# Patient Record
Sex: Female | Born: 1995
Health system: Southern US, Community
[De-identification: ages and names within clinical notes are randomized; demographics above are authoritative.]

## PROBLEM LIST (undated history)

## (undated) DIAGNOSIS — R87629 Unspecified abnormal cytological findings in specimens from vagina: Secondary | ICD-10-CM

## (undated) DIAGNOSIS — J45909 Unspecified asthma, uncomplicated: Secondary | ICD-10-CM

## (undated) DIAGNOSIS — E0590 Thyrotoxicosis, unspecified without thyrotoxic crisis or storm: Secondary | ICD-10-CM

## (undated) HISTORY — DX: Thyrotoxicosis, unspecified without thyrotoxic crisis or storm: E05.90

## (undated) HISTORY — DX: Unspecified abnormal cytological findings in specimens from vagina: R87.629

## (undated) HISTORY — PX: TONSILECTOMY/ADENOIDECTOMY WITH MYRINGOTOMY: SHX6125

---

## 2015-09-14 ENCOUNTER — Encounter: Payer: Self-pay | Admitting: Endocrinology

## 2015-09-14 ENCOUNTER — Ambulatory Visit (INDEPENDENT_AMBULATORY_CARE_PROVIDER_SITE_OTHER): Payer: BLUE CROSS/BLUE SHIELD | Admitting: Endocrinology

## 2015-09-14 VITALS — BP 116/62 | HR 112 | Temp 98.5°F | Ht 63.0 in | Wt 108.0 lb

## 2015-09-14 DIAGNOSIS — E0590 Thyrotoxicosis, unspecified without thyrotoxic crisis or storm: Secondary | ICD-10-CM

## 2015-09-14 MED ORDER — METHIMAZOLE 10 MG PO TABS: 20.0000 mg | ORAL_TABLET | Freq: Two times a day (BID) | ORAL | Status: DC

## 2015-09-14 NOTE — Patient Instructions (Addendum)
i have sent a prescription to your pharmacy, to slow the thyroid. if ever you have fever while taking methimazole, stop it and call us, even if the reason is obvious, because of the risk of a rare side-effect. In view of your medical condition, you should avoid pregnancy until we have decided it is safe.   The pill you were probably given for the heart rate was metoprolol or atenolol.  If you take 1 of these, do not exceed 25 mg per day, or your blood pressure could go too low.   Please come back for a follow-up appointment in 4-6 weeks.

## 2015-09-14 NOTE — Progress Notes (Signed)
Subjective:    Patient ID: Lindsay Walker, female    DOB: June 13, 1996, 19 y.o.   MRN: [id number]  HPI    Pt states 1 month of moderate tremor of the hands, and assoc itching.  She has no prior h/o any thyroid problem.  she has never had thyroid imaging.  she has never had XRT to the anterior neck, or thyroid surgery.  she does not consume kelp or any other prescribed or non-prescribed thyroid medication.  she has never been on amiodarone.  No past medical history on file.  No past surgical history on file.  Social History   Social History  . Marital Status: Single    Spouse Name: N/A  . Number of Children: N/A  . Years of Education: N/A   Occupational History  . Not on file.   Social History Main Topics  . Smoking status: Never Smoker   . Smokeless tobacco: Not on file  . Alcohol Use: Not on file  . Drug Use: Not on file  . Sexual Activity: Not on file   Other Topics Concern  . Not on file   Social History Narrative  . No narrative on file    No current outpatient prescriptions on file prior to visit.   No current facility-administered medications on file prior to visit.    Allergies  Allergen Reactions  . Penicillins     Hives Swelling     Family History  Problem Relation Age of Onset  . Thyroid disease Neg Hx     BP 116/62 mmHg  Pulse 112  Temp(Src) 98.5 F (36.9 C) (Oral)  Ht  (1.6 m)  Wt 108 lb (48.988 kg)  BMI 19.14 kg/m2  SpO2 97%  LMP 09/13/2015  Review of Systems denies weight loss, hoarseness, visual loss, sob, diarrhea, polyuria, muscle weakness, excessive diaphoresis, numbness, heat intolerance, easy bruising, and rhinorrhea.  She has headache, anxiety, and palpitations.     Objective:   Physical Exam VS: see vs page GEN: no distress HEAD: head: no deformity eyes: no periorbital swelling; slight bilat proptosis external nose and ears are normal mouth: no lesion seen NECK: thyroid is 3-5 times normal size, R>L, firm  texture. CHEST WALL: no deformity LUNGS:  Clear to auscultation CV: tachycardic rate, but reg rhythm, no murmur ABD: abdomen is soft, nontender.  no hepatosplenomegaly.  not distended.  no hernia.   MUSCULOSKELETAL: muscle bulk and strength are grossly normal.  no obvious joint swelling.  gait is normal and steady EXTEMITIES: no deformity.  no edema. NEURO:  cn 2-12 grossly intact.   readily moves all 4's.  sensation is intact to touch on all 4's.  Slight tremor of the hands SKIN:  Normal texture and temperature.  No rash or suspicious lesion is visible.   NODES:  None palpable at the neck PSYCH: alert, well-oriented.  Does not appear anxious nor depressed.   I have reviewed outside records, and summarized:  Pt was noted to have hyperthyroidism, and referred here.  outside test results are reviewed: TSH is undetectable.     Assessment & Plan:  Hyperthyroidism, new, prob due to Grave's dz.  Tapazole is chosen as initial rx, due to tachycardia  Patient is advised the following: Patient Instructions  i have sent a prescription to your pharmacy, to slow the thyroid. if ever you have fever while taking methimazole, stop it and call us, even if the reason is obvious, because of the risk of a rare side-effect. In view  of your medical condition, you should avoid pregnancy until we have decided it is safe.   The pill you were probably given for the heart rate was metoprolol or atenolol.  If you take 1 of these, do not exceed 25 mg per day, or your blood pressure could go too low.   Please come back for a follow-up appointment in 4-6 weeks.

## 2015-09-16 DIAGNOSIS — E0590 Thyrotoxicosis, unspecified without thyrotoxic crisis or storm: Secondary | ICD-10-CM | POA: Insufficient documentation

## 2015-09-17 ENCOUNTER — Ambulatory Visit: Payer: Self-pay | Admitting: Neurology

## 2015-11-05 ENCOUNTER — Ambulatory Visit: Payer: BLUE CROSS/BLUE SHIELD | Admitting: Endocrinology

## 2015-11-14 ENCOUNTER — Encounter: Payer: Self-pay | Admitting: Endocrinology

## 2015-11-14 ENCOUNTER — Ambulatory Visit (INDEPENDENT_AMBULATORY_CARE_PROVIDER_SITE_OTHER): Payer: BLUE CROSS/BLUE SHIELD | Admitting: Endocrinology

## 2015-11-14 VITALS — BP 106/62 | HR 134 | Temp 98.2°F | Ht 63.0 in | Wt 115.0 lb

## 2015-11-14 DIAGNOSIS — E0590 Thyrotoxicosis, unspecified without thyrotoxic crisis or storm: Secondary | ICD-10-CM | POA: Diagnosis not present

## 2015-11-14 LAB — TSH: TSH: 0.01 u[IU]/mL — AB (ref 0.35–5.50)

## 2015-11-14 NOTE — Patient Instructions (Signed)
blood tests are requested for you today.  We'll let you know about the results.  if ever you have fever while taking methimazole, stop it and call us, even if the reason is obvious, because of the risk of a rare side-effect. In view of your medical condition, you should avoid pregnancy until we have decided it is safe.   Please come back for a follow-up appointment in 2 months.

## 2015-11-14 NOTE — Progress Notes (Signed)
   Subjective:    Patient ID: Lindsay Walker, female    DOB: Nov 28, 1995, 20 y.o.   MRN: [id number]  HPI Pt returns for f/u of hyperthyroidism (dx'ed late 2016; she has never had thyroid imaging, but Grave's Dz is presumed, due to severity; tapazole is chosen as initial rx, due to tachycardia).  pt states she feels better in general, except for persistent palpitations. She misses the PM tapazole dose approx twice a week. No past medical history on file.  No past surgical history on file.  Social History   Social History  . Marital Status: Single    Spouse Name: N/A  . Number of Children: N/A  . Years of Education: N/A   Occupational History  . Not on file.   Social History Main Topics  . Smoking status: Never Smoker   . Smokeless tobacco: Not on file  . Alcohol Use: Not on file  . Drug Use: Not on file  . Sexual Activity: Not on file   Other Topics Concern  . Not on file   Social History Narrative    Current Outpatient Prescriptions on File Prior to Visit  Medication Sig Dispense Refill  . albuterol (PROVENTIL) (5 MG/ML) 0.5% nebulizer solution Take 2.5 mg by nebulization every 6 (six) hours as needed for wheezing or shortness of breath.    . loratadine (CLARITIN) 10 MG tablet Take 10 mg by mouth daily.     No current facility-administered medications on file prior to visit.    Allergies  Allergen Reactions  . Penicillins     Hives Swelling     Family History  Problem Relation Age of Onset  . Thyroid disease Neg Hx     BP 106/62 mmHg  Pulse 134  Temp(Src) 98.2 F (36.8 C) (Oral)  Ht  (1.6 m)  Wt 115 lb (52.164 kg)  BMI 20.38 kg/m2  SpO2 97%  Review of Systems Denies fever    Objective:   Physical Exam VITAL SIGNS:  See vs page GENERAL: no distress NECK: thyroid is 3-5 times normal size, R>L, firm texture. Neuro: no tremor  Lab Results  Component Value Date   TSH 0.01* 11/14/2015       Assessment & Plan:  Hyperthyroidism, persistent  despite rx  Patient is advised the following: Patient Instructions  blood tests are requested for you today.  We'll let you know about the results.  if ever you have fever while taking methimazole, stop it and call us, even if the reason is obvious, because of the risk of a rare side-effect. In view of your medical condition, you should avoid pregnancy until we have decided it is safe.   Please come back for a follow-up appointment in 2 months.   addendum: increase tapazole to 40-BID.  Please come back for a follow-up appointment in 1 month.

## 2015-11-15 LAB — T4, FREE: Free T4: 3.02 ng/dL — ABNORMAL HIGH (ref 0.60–1.60)

## 2015-11-15 MED ORDER — METHIMAZOLE 10 MG PO TABS: 40.0000 mg | ORAL_TABLET | Freq: Two times a day (BID) | ORAL | Status: DC

## 2016-01-11 ENCOUNTER — Ambulatory Visit: Payer: BLUE CROSS/BLUE SHIELD | Admitting: Endocrinology

## 2016-01-30 ENCOUNTER — Encounter: Payer: Self-pay | Admitting: Endocrinology

## 2016-01-30 ENCOUNTER — Ambulatory Visit (INDEPENDENT_AMBULATORY_CARE_PROVIDER_SITE_OTHER): Payer: Self-pay | Admitting: Endocrinology

## 2016-01-30 VITALS — BP 106/70 | HR 87 | Temp 98.1°F | Ht 63.0 in | Wt 126.0 lb

## 2016-01-30 DIAGNOSIS — E0590 Thyrotoxicosis, unspecified without thyrotoxic crisis or storm: Secondary | ICD-10-CM

## 2016-01-30 LAB — T4, FREE: Free T4: 0.77 ng/dL (ref 0.60–1.60)

## 2016-01-30 LAB — TSH: TSH: 0.03 u[IU]/mL — ABNORMAL LOW (ref 0.35–5.50)

## 2016-01-30 MED ORDER — METHIMAZOLE 10 MG PO TABS: 20.0000 mg | ORAL_TABLET | Freq: Two times a day (BID) | ORAL | Status: DC

## 2016-01-30 NOTE — Progress Notes (Signed)
   Subjective:    Patient ID: Lindsay Walker, female    DOB: 06/11/1996, 20 y.o.   MRN: [id number]  HPI Pt returns for f/u of hyperthyroidism (dx'ed late 2016; she has never had thyroid imaging, but Grave's Dz is presumed, due to severity; tapazole is chosen as initial rx, due to tachycardia).  pt states she feels better in general.  She says she seldom misses the tapazole.   No past medical history on file.  No past surgical history on file.  Social History   Social History  . Marital Status: Single    Spouse Name: N/A  . Number of Children: N/A  . Years of Education: N/A   Occupational History  . Not on file.   Social History Main Topics  . Smoking status: Never Smoker   . Smokeless tobacco: Not on file  . Alcohol Use: Not on file  . Drug Use: Not on file  . Sexual Activity: Not on file   Other Topics Concern  . Not on file   Social History Narrative    Current Outpatient Prescriptions on File Prior to Visit  Medication Sig Dispense Refill  . albuterol (PROVENTIL) (5 MG/ML) 0.5% nebulizer solution Take 2.5 mg by nebulization every 6 (six) hours as needed for wheezing or shortness of breath.    . loratadine (CLARITIN) 10 MG tablet Take 10 mg by mouth daily.     No current facility-administered medications on file prior to visit.    Allergies  Allergen Reactions  . Penicillins     Hives Swelling     Family History  Problem Relation Age of Onset  . Thyroid disease Neg Hx     BP 106/70 mmHg  Pulse 87  Temp(Src) 98.1 F (36.7 C) (Oral)  Ht 5\' 3"  (1.6 m)  Wt 126 lb (57.153 kg)  BMI 22.33 kg/m2  SpO2 98%  Review of Systems Denies fever    Objective:   Physical Exam VITAL SIGNS:  See vs page GENERAL: no distress NECK: thyroid is 3-5 times normal size, R>L, firm texture. Neuro: no tremor.     Lab Results  Component Value Date   TSH 0.03* 01/30/2016      Assessment & Plan:  Hyperthyroidism: improved.  Patient is advised the following: Patient  Instructions  blood tests are requested for you today.  We'll let you know about the results.  if ever you have fever while taking methimazole, stop it and call us, even if the reason is obvious, because of the risk of a rare side-effect. In view of your medical condition, you should avoid pregnancy until we have decided it is safe.   Let me know if you decide to take the radioactive iodine pill.   Please come back for a follow-up appointment in 2-3 months.   addendum: reduce tapazole to 20 mg bid.

## 2016-01-30 NOTE — Patient Instructions (Addendum)
blood tests are requested for you today.  We'll let you know about the results.  if ever you have fever while taking methimazole, stop it and call us, even if the reason is obvious, because of the risk of a rare side-effect. In view of your medical condition, you should avoid pregnancy until we have decided it is safe.   Let me know if you decide to take the radioactive iodine pill.   Please come back for a follow-up appointment in 2-3 months.

## 2016-04-25 ENCOUNTER — Ambulatory Visit: Payer: BLUE CROSS/BLUE SHIELD | Admitting: Endocrinology

## 2016-04-28 ENCOUNTER — Ambulatory Visit: Payer: BLUE CROSS/BLUE SHIELD | Admitting: Endocrinology

## 2016-04-30 ENCOUNTER — Encounter (HOSPITAL_COMMUNITY): Payer: Self-pay | Admitting: Emergency Medicine

## 2016-04-30 ENCOUNTER — Ambulatory Visit (HOSPITAL_COMMUNITY)
Admission: EM | Admit: 2016-04-30 | Discharge: 2016-04-30 | Disposition: A | Discharge status: Home / Self Care | Payer: BLUE CROSS/BLUE SHIELD | Attending: Emergency Medicine | Admitting: Emergency Medicine

## 2016-04-30 DIAGNOSIS — Z880 Allergy status to penicillin: Secondary | ICD-10-CM | POA: Insufficient documentation

## 2016-04-30 DIAGNOSIS — N760 Acute vaginitis: Secondary | ICD-10-CM | POA: Diagnosis present

## 2016-04-30 DIAGNOSIS — Z79899 Other long term (current) drug therapy: Secondary | ICD-10-CM | POA: Insufficient documentation

## 2016-04-30 HISTORY — DX: Unspecified asthma, uncomplicated: J45.909

## 2016-04-30 LAB — POCT URINALYSIS DIP (DEVICE)
Bilirubin Urine: NEGATIVE
Glucose, UA: NEGATIVE mg/dL
Ketones, ur: NEGATIVE mg/dL
Leukocytes, UA: NEGATIVE
NITRITE: NEGATIVE
PH: 6.5 (ref 5.0–8.0)
Protein, ur: 30 mg/dL — AB
Specific Gravity, Urine: 1.025 (ref 1.005–1.030)
UROBILINOGEN UA: 0.2 mg/dL (ref 0.0–1.0)

## 2016-04-30 LAB — POCT PREGNANCY, URINE: Preg Test, Ur: NEGATIVE

## 2016-04-30 MED ORDER — METRONIDAZOLE 500 MG PO TABS: 500.0000 mg | ORAL_TABLET | Freq: Two times a day (BID) | ORAL | Status: DC

## 2016-04-30 MED ORDER — FLUCONAZOLE 150 MG PO TABS: 150.0000 mg | ORAL_TABLET | Freq: Every day | ORAL | Status: DC

## 2016-04-30 NOTE — ED Notes (Signed)
Patient reports vaginal irritation for 4 weeks.  Denies vaginal discharge, denies abdominal pain, denies back pain.  Denies uti symptoms.  Patient has been off a birth control for more than a year.

## 2016-04-30 NOTE — Discharge Instructions (Signed)
Start Flagyl as directed. Take Diflucan 1 tablet today. Then repeat 1 tablet in 3 to 4 days. Follow-up with your primary care provider if symptoms do not resolve in 7 days.    Vaginitis Vaginitis is an inflammation of the vagina. It is most often caused by a change in the normal balance of the bacteria and yeast that live in the vagina. This change in balance causes an overgrowth of certain bacteria or yeast, which causes the inflammation. There are different types of vaginitis, but the most common types are:  Bacterial vaginosis.  Yeast infection (candidiasis).  Trichomoniasis vaginitis. This is a sexually transmitted infection (STI).  Viral vaginitis.  Atrophic vaginitis.  Allergic vaginitis. CAUSES  The cause depends on the type of vaginitis. Vaginitis can be caused by:  Bacteria (bacterial vaginosis).  Yeast (yeast infection).  A parasite (trichomoniasis vaginitis)  A virus (viral vaginitis).  Low hormone levels (atrophic vaginitis). Low hormone levels can occur during pregnancy, breastfeeding, or after menopause.  Irritants, such as bubble baths, scented tampons, and feminine sprays (allergic vaginitis). Other factors can change the normal balance of the yeast and bacteria that live in the vagina. These include:  Antibiotic medicines.  Poor hygiene.  Diaphragms, vaginal sponges, spermicides, birth control pills, and intrauterine devices (IUD).  Sexual intercourse.  Infection.  Uncontrolled diabetes.  A weakened immune system. SYMPTOMS  Symptoms can vary depending on the cause of the vaginitis. Common symptoms include:  Abnormal vaginal discharge.  The discharge is white, gray, or yellow with bacterial vaginosis.  The discharge is thick, white, and cheesy with a yeast infection.  The discharge is frothy and yellow or greenish with trichomoniasis.  A bad vaginal odor.  The odor is fishy with bacterial vaginosis.  Vaginal itching, pain, or  swelling.  Painful intercourse.  Pain or burning when urinating. Sometimes, there are no symptoms. TREATMENT  Treatment will vary depending on the type of infection.   Bacterial vaginosis and trichomoniasis are often treated with antibiotic creams or pills.  Yeast infections are often treated with antifungal medicines, such as vaginal creams or suppositories.  Viral vaginitis has no cure, but symptoms can be treated with medicines that relieve discomfort. Your sexual partner should be treated as well.  Atrophic vaginitis may be treated with an estrogen cream, pill, suppository, or vaginal ring. If vaginal dryness occurs, lubricants and moisturizing creams may help. You may be told to avoid scented soaps, sprays, or douches.  Allergic vaginitis treatment involves quitting the use of the product that is causing the problem. Vaginal creams can be used to treat the symptoms. HOME CARE INSTRUCTIONS   Take all medicines as directed by your caregiver.  Keep your genital area clean and dry. Avoid soap and only rinse the area with water.  Avoid douching. It can remove the healthy bacteria in the vagina.  Do not use tampons or have sexual intercourse until your vaginitis has been treated. Use sanitary pads while you have vaginitis.  Wipe from front to back. This avoids the spread of bacteria from the rectum to the vagina.  Let air reach your genital area.  Wear cotton underwear to decrease moisture buildup.  Avoid wearing underwear while you sleep until your vaginitis is gone.  Avoid tight pants and underwear or nylons without a cotton panel.  Take off wet clothing (especially bathing suits) as soon as possible.  Use mild, non-scented products. Avoid using irritants, such as:  Scented feminine sprays.  Fabric softeners.  Scented detergents.  Scented tampons.  Scented soaps or bubble baths.  Practice safe sex and use condoms. Condoms may prevent the spread of trichomoniasis  and viral vaginitis. SEEK MEDICAL CARE IF:   You have abdominal pain.  You have a fever or persistent symptoms for more than 2-3 days.  You have a fever and your symptoms suddenly get worse.   This information is not intended to replace advice given to you by your health care provider. Make sure you discuss any questions you have with your health care provider.   Document Released: 07/27/2007 Document Revised: 02/13/2015 Document Reviewed: 03/11/2012 Elsevier Interactive Patient Education [employer]2016 [employer] Inc[employer]

## 2016-05-01 LAB — CERVICOVAGINAL ANCILLARY ONLY
Chlamydia: NEGATIVE
Neisseria Gonorrhea: NEGATIVE
WET PREP (BD AFFIRM): NEGATIVE

## 2016-05-01 NOTE — ED Provider Notes (Signed)
CSN: [id number]     Arrival date & time 04/30/16  1235 History   First MD Initiated Contact with Patient 04/30/16 1344     Chief Complaint  Patient presents with  . Vaginal Itching   (Consider location/radiation/quality/duration/timing/severity/associated sxs/prior Treatment) HPI Comments: Patient presents with vaginal irritation and slight itching for about 1 month. She denies any discharge, unusual bleeding, dysuria or back pain. She admits to using a new body wash in the vaginal area. She is sexually active with 1 partner and occasionally uses condoms. She is currently on her period. She has no previous history of STD's. She has not tried anything for her symptoms.   Patient is a 20 y.o. female presenting with vaginal itching. The history is provided by the patient.  Vaginal Itching This is a new problem. The current episode started more than 1 week ago. The problem occurs daily. The problem has not changed since onset.Pertinent negatives include no abdominal pain.    Past Medical History  Diagnosis Date  . Asthma    History reviewed. No pertinent past surgical history. Family History  Problem Relation Age of Onset  . Thyroid disease Neg Hx   . Asthma Mother    Social History  Substance Use Topics  . Smoking status: Never Smoker   . Smokeless tobacco: None  . Alcohol Use: No   OB History    No data available     Review of Systems  Constitutional: Negative for fever.  Gastrointestinal: Negative for nausea and abdominal pain.  Genitourinary: Negative for dysuria, flank pain, vaginal discharge, genital sores, menstrual problem and pelvic pain.       Vaginal itching    Allergies  Penicillins  Home Medications   Prior to Admission medications   Medication Sig Start Date End Date Taking? Authorizing Provider  albuterol (PROVENTIL) (5 MG/ML) 0.5% nebulizer solution Take 2.5 mg by nebulization every 6 (six) hours as needed for wheezing or shortness of breath.    Historical  Provider, MD  fluconazole (DIFLUCAN) 150 MG tablet Take 1 tablet (150 mg total) by mouth daily. Repeat in 3 days. 04/30/16   Sudie GrumblingAnn Berry Barnard Sharps, NP  loratadine (CLARITIN) 10 MG tablet Take 10 mg by mouth daily.    Historical Provider, MD  methimazole (TAPAZOLE) 10 MG tablet Take 2 tablets (20 mg total) by mouth 2 (two) times daily. 01/30/16   Romero BellingSean Ellison, MD  metroNIDAZOLE (FLAGYL) 500 MG tablet Take 1 tablet (500 mg total) by mouth 2 (two) times daily. For 7 days. Take with food. No alcohol. 04/30/16   Sudie GrumblingAnn Berry Devion Chriscoe, NP   Meds Ordered and Administered this Visit  Medications - No data to display  BP 118/75 mmHg  Pulse 65  Temp(Src) 98.2 F (36.8 C) (Oral)  Resp 16  Ht 5\' 3"  (1.6 m)  Wt 125 lb (56.7 kg)  BMI 22.15 kg/m2  SpO2 100%  LMP 04/30/2016 No data found.   Physical Exam  Constitutional: She is oriented to person, place, and time. She appears well-developed and well-nourished.  Cardiovascular: Normal rate, regular rhythm and normal heart sounds.   Pulmonary/Chest: Effort normal and breath sounds normal.  Abdominal: Soft. Bowel sounds are normal. There is no tenderness. There is no CVA tenderness.  Genitourinary: Uterus normal. There is no tenderness or lesion on the right labia. There is no tenderness or lesion on the left labia. Cervix exhibits no motion tenderness. There is bleeding in the vagina. No erythema or tenderness in the vagina.  Vaginal blood  present with positive fishy odor. Irritation at perineum and vaginal vault area.No fissures or cysts seen. Non-tender.    Neurological: She is alert and oriented to person, place, and time.  Skin: Skin is warm and dry.    ED Course  Procedures (including critical care time)  Labs Review Labs Reviewed  POCT URINALYSIS DIP (DEVICE) - Abnormal; Notable for the following:    Hgb urine dipstick LARGE (*)    Protein, ur 30 (*)    All other components within normal limits  POCT PREGNANCY, URINE  CERVICOVAGINAL ANCILLARY ONLY   CERVICOVAGINAL ANCILLARY ONLY    Imaging Review No results found.   Visual Acuity Review  Right Eye Distance:   Left Eye Distance:   Bilateral Distance:    Right Eye Near:   Left Eye Near:    Bilateral Near:         MDM   1. Vaginitis and vulvovaginitis   Cervical specimen collected for GC/Chlamydia/Trich/Candida and BV. Reviewed with patient likelihood of BV due to clinical findings. May also have a external yeast infection. Recommend Flagyl as directed- reviewed no alcohol while on medication. May also take Diflucan today and repeat in 3 days. Discussed that urine was positive for blood and protein which most likely is due to vaginal bleeding of period. Reviewed no sex until finished medication and lab results have been discussed. Will follow-up pending lab results.     Sudie Grumbling, NP 05/01/16 2259

## 2016-05-08 ENCOUNTER — Ambulatory Visit: Payer: BLUE CROSS/BLUE SHIELD | Admitting: Endocrinology

## 2016-05-08 DIAGNOSIS — Z0289 Encounter for other administrative examinations: Secondary | ICD-10-CM

## 2016-07-07 ENCOUNTER — Encounter: Payer: Self-pay | Admitting: Endocrinology

## 2016-07-07 ENCOUNTER — Ambulatory Visit (INDEPENDENT_AMBULATORY_CARE_PROVIDER_SITE_OTHER): Payer: Managed Care, Other (non HMO) | Admitting: Endocrinology

## 2016-07-07 VITALS — BP 112/56 | HR 119 | Ht 63.0 in | Wt 109.0 lb

## 2016-07-07 DIAGNOSIS — E0590 Thyrotoxicosis, unspecified without thyrotoxic crisis or storm: Secondary | ICD-10-CM | POA: Diagnosis not present

## 2016-07-07 LAB — T4, FREE: FREE T4: 5.33 ng/dL — AB (ref 0.60–1.60)

## 2016-07-07 LAB — TSH: TSH: 0.04 u[IU]/mL — AB (ref 0.35–5.50)

## 2016-07-07 MED ORDER — METHIMAZOLE 10 MG PO TABS: 40.0000 mg | ORAL_TABLET | Freq: Two times a day (BID) | ORAL | 5 refills | Status: DC

## 2016-07-07 NOTE — Patient Instructions (Addendum)
blood tests are requested for you today.  We'll let you know about the results. I have sent a prescription to your pharmacy, to resume the methimazole Let us know when you can do the radioactive iodine pill. If ever you have fever while taking methimazole, stop it and call us, even if the reason is obvious, because of the risk of a rare side-effect. Please come back for a follow-up appointment in 2-3 weeks.

## 2016-07-07 NOTE — Progress Notes (Signed)
   Subjective:    Patient ID: Lindsay HarborShalia Walker, female    DOB: 01/19/1996, 20 y.o.   MRN: [id number]  HPI Pt returns for f/u of hyperthyroidism (dx'ed late 2016; she has never had thyroid imaging, but Grave's Dz is presumed, due to severity; tapazole is chosen as initial rx, also due to severity).  She has not taken tapazole in the past 3 months.  She has moderate tremor of the hands, and assoc weight loss.  She takes OC's as rx'ed.  She would like to take RAI, but cannot afford it now.   Past Medical History:  Diagnosis Date  . Asthma   . Hyperthyroidism     No past surgical history on file.  Social History   Social History  . Marital status: Single    Spouse name: N/A  . Number of children: N/A  . Years of education: N/A   Occupational History  . Not on file.   Social History Main Topics  . Smoking status: Never Smoker  . Smokeless tobacco: Not on file  . Alcohol use No  . Drug use: No  . Sexual activity: Not on file   Other Topics Concern  . Not on file   Social History Narrative  . No narrative on file    Current Outpatient Prescriptions on File Prior to Visit  Medication Sig Dispense Refill  . albuterol (PROVENTIL) (5 MG/ML) 0.5% nebulizer solution Take 2.5 mg by nebulization every 6 (six) hours as needed for wheezing or shortness of breath.    . fluconazole (DIFLUCAN) 150 MG tablet Take 1 tablet (150 mg total) by mouth daily. Repeat in 3 days. 2 tablet 0  . loratadine (CLARITIN) 10 MG tablet Take 10 mg by mouth daily.     No current facility-administered medications on file prior to visit.     Allergies  Allergen Reactions  . Penicillins     Hives Swelling     Family History  Problem Relation Age of Onset  . Thyroid disease Neg Hx   . Asthma Mother     BP (!) 112/56   Pulse (!) 119   Ht 5\' 3"  (1.6 m)   Wt 109 lb (49.4 kg)   SpO2 97%   BMI 19.31 kg/m   Review of Systems Denies denies fever, but she has palpitations.      Objective:   Physical  Exam VITAL SIGNS:  See vs page GENERAL: no distress NECK: thyroid is 5 times normal size, R>L, firm texture. Neuro: slight tremor of the hands  Lab Results  Component Value Date   TSH 0.04 (L) 07/07/2016      Assessment & Plan:  Hyperthyroidism, worse.  Noncompliance with medication: I'll work around this as best I can.  low normal BP, prob due to weight loss.  This is a relative contraindication to b-blocker.

## 2016-07-11 ENCOUNTER — Telehealth: Payer: Self-pay | Admitting: Endocrinology

## 2016-07-11 MED ORDER — METHIMAZOLE 10 MG PO TABS: 20.0000 mg | ORAL_TABLET | Freq: Three times a day (TID) | ORAL | 5 refills | Status: DC

## 2016-07-11 NOTE — Telephone Encounter (Signed)
I contacted the patient and advised of message via voicemail. Requested a call back if the patient would like to discuss.  

## 2016-07-11 NOTE — Telephone Encounter (Signed)
See message and please advise on how to proceed. Thanks!  

## 2016-07-11 NOTE — Telephone Encounter (Signed)
Methimazole 8 pills daily the insurance is not going to cover this rx.   Please call the pharmacy for what they are stating we should do pt is unaware but does not want to miss her dosing she has none at all right now

## 2016-07-11 NOTE — Telephone Encounter (Signed)
Ok, I have sent a prescription to your pharmacy, to change to 20 mg tid.  If this is hard to remember, take 3 pills, twice a day

## 2016-07-18 ENCOUNTER — Ambulatory Visit: Payer: Managed Care, Other (non HMO) | Admitting: Endocrinology

## 2016-08-18 ENCOUNTER — Ambulatory Visit: Payer: Managed Care, Other (non HMO) | Admitting: Endocrinology

## 2016-08-20 ENCOUNTER — Other Ambulatory Visit (INDEPENDENT_AMBULATORY_CARE_PROVIDER_SITE_OTHER): Payer: Managed Care, Other (non HMO)

## 2016-08-20 ENCOUNTER — Ambulatory Visit (INDEPENDENT_AMBULATORY_CARE_PROVIDER_SITE_OTHER): Payer: Managed Care, Other (non HMO) | Admitting: Endocrinology

## 2016-08-20 ENCOUNTER — Encounter: Payer: Self-pay | Admitting: Endocrinology

## 2016-08-20 VITALS — BP 102/60 | HR 80 | Ht 63.0 in | Wt 113.0 lb

## 2016-08-20 DIAGNOSIS — E0590 Thyrotoxicosis, unspecified without thyrotoxic crisis or storm: Secondary | ICD-10-CM

## 2016-08-20 LAB — T4, FREE: FREE T4: 0.86 ng/dL (ref 0.60–1.60)

## 2016-08-20 LAB — TSH: TSH: 0.01 u[IU]/mL — AB (ref 0.35–5.50)

## 2016-08-20 MED ORDER — METHIMAZOLE 10 MG PO TABS: 20.0000 mg | ORAL_TABLET | Freq: Two times a day (BID) | ORAL | 5 refills | Status: DC

## 2016-08-20 NOTE — Progress Notes (Signed)
   Subjective:    Patient ID: Lindsay Walker, female    DOB: 07/10/1996, 20 y.o.   MRN: [id number]  HPI Pt returns for f/u of hyperthyroidism (dx'ed late 2016; she has never had thyroid imaging, but Grave's Dz is presumed, due to severity; tapazole is chosen as initial rx, also due to severity; she takes OC's as rx'ed; she would like to take RAI, but cannot afford it now).  Since back on the tapazole, she feels much better Past Medical History:  Diagnosis Date  . Asthma   . Hyperthyroidism     No past surgical history on file.  Social History   Social History  . Marital status: Single    Spouse name: N/A  . Number of children: N/A  . Years of education: N/A   Occupational History  . Not on file.   Social History Main Topics  . Smoking status: Never Smoker  . Smokeless tobacco: Not on file  . Alcohol use No  . Drug use: No  . Sexual activity: Not on file   Other Topics Concern  . Not on file   Social History Narrative  . No narrative on file    Current Outpatient Prescriptions on File Prior to Visit  Medication Sig Dispense Refill  . albuterol (PROVENTIL) (5 MG/ML) 0.5% nebulizer solution Take 2.5 mg by nebulization every 6 (six) hours as needed for wheezing or shortness of breath.     No current facility-administered medications on file prior to visit.     Allergies  Allergen Reactions  . Penicillins     Hives Swelling     Family History  Problem Relation Age of Onset  . Thyroid disease Neg Hx   . Asthma Mother     BP 102/60   Pulse 80   Ht 5\' 3"  (1.6 m)   Wt 113 lb (51.3 kg)   SpO2 98%   BMI 20.02 kg/m    Review of Systems Denies fever    Objective:   Physical Exam VITAL SIGNS:  See vs page GENERAL: no distress NECK: thyroid is 5-10 times normal size, R>L, firm texture. Skin: not diaphoretic.   Neuro: no tremor  Lab Results  Component Value Date   TSH 0.01 (L) 08/20/2016      Assessment & Plan:  Hyperthyroidism: control is improved. I  have sent a prescription to your pharmacy, to reduce tapazole

## 2016-08-20 NOTE — Patient Instructions (Signed)
blood tests are requested for you today.  We'll let you know about the results. Let us know if you decide to take the radioactive iodine pill. If ever you have fever while taking methimazole, stop it and call us, even if the reason is obvious, because of the risk of a rare side-effect. Please come back for a follow-up appointment in 1 month.

## 2016-09-19 ENCOUNTER — Ambulatory Visit: Payer: Managed Care, Other (non HMO) | Admitting: Endocrinology

## 2016-10-20 ENCOUNTER — Ambulatory Visit: Payer: Managed Care, Other (non HMO) | Admitting: Endocrinology

## 2016-10-30 ENCOUNTER — Ambulatory Visit: Payer: Managed Care, Other (non HMO) | Admitting: Endocrinology

## 2017-03-11 ENCOUNTER — Ambulatory Visit (INDEPENDENT_AMBULATORY_CARE_PROVIDER_SITE_OTHER): Payer: BLUE CROSS/BLUE SHIELD | Admitting: Family Medicine

## 2017-03-11 ENCOUNTER — Other Ambulatory Visit (HOSPITAL_COMMUNITY)
Admission: RE | Admit: 2017-03-11 | Discharge: 2017-03-11 | Disposition: A | Discharge status: Home / Self Care | Payer: BLUE CROSS/BLUE SHIELD | Source: Ambulatory Visit | Attending: Family Medicine | Admitting: Family Medicine

## 2017-03-11 ENCOUNTER — Ambulatory Visit: Payer: Managed Care, Other (non HMO) | Admitting: Family Medicine

## 2017-03-11 VITALS — BP 112/72 | HR 95 | Temp 98.8°F | Ht 63.0 in | Wt 113.4 lb

## 2017-03-11 DIAGNOSIS — J45909 Unspecified asthma, uncomplicated: Secondary | ICD-10-CM | POA: Diagnosis not present

## 2017-03-11 DIAGNOSIS — N898 Other specified noninflammatory disorders of vagina: Secondary | ICD-10-CM | POA: Insufficient documentation

## 2017-03-11 DIAGNOSIS — E0590 Thyrotoxicosis, unspecified without thyrotoxic crisis or storm: Secondary | ICD-10-CM | POA: Insufficient documentation

## 2017-03-11 DIAGNOSIS — A630 Anogenital (venereal) warts: Secondary | ICD-10-CM

## 2017-03-11 MED ORDER — FLUCONAZOLE 150 MG PO TABS: 150.0000 mg | ORAL_TABLET | Freq: Once | ORAL | 0 refills | Status: AC

## 2017-03-11 NOTE — Patient Instructions (Signed)
It was very nice to see you today- take care and I will be in touch with your labs asap We are going to treat you for yeast vaginitis with diflucan- take one pill, and if necessary you can repeat in 1 week You do appear to have some genital warts- we treated by freezing today, and can re-freeze in 3-4 weeks.  Let me know if any concerns- some soreness and possibly small blisters after freezing is normal but don't hesitate to contact me if you are worried

## 2017-03-11 NOTE — Progress Notes (Signed)
[facility] [address] [place], [place] [address] [phone]  Date:  03/11/2017   Name:  Lindsay Walker   DOB:  29-Nov-1995   MRN:  [id number]  PCP:  Assunta Found, PA    Chief Complaint: Vaginal Itching (c/o vaginal itching that comes and goes that started yestereday. )   History of Present Illness:  Lindsay Walker is a 21 y.o. very pleasant female patient who presents with the following:  She has noted vagianl itching for a couple of days and would like to make sure all is well No burning No discharge She has had a yeast infection in the past, but this seems different No recent abx use   She has hyperthyroidism she is on methimazole.  She takes 60 mg a day- her endocrinologist is in [place]- she cannot think of their name.  She may at some point have an ablation procedure but for now they are suppressing her thyroid medically    She has asthma- however this does not really bother her and she is not on any medication for same  She is on Depo- provera for her contraceptive Her last shot was in March- she is UTD She did have a pap early this year- it was negative She is s/p Gardasil series  She is a Consulting civil engineer at [employer]; she majors in Building surveyor and Spanish and hopes to attend veterinary school after she graduates next year.  She also works part time.  She will be taking summer classes  She is SA but has not had any new partners recently  Patient Active Problem List   Diagnosis Date Noted  . Hyperthyroidism 09/16/2015    Past Medical History:  Diagnosis Date  . Asthma   . Hyperthyroidism     No past surgical history on file.  Social History  Substance Use Topics  . Smoking status: Never Smoker  . Smokeless tobacco: Not on file  . Alcohol use No    Family History  Problem Relation Age of Onset  . Thyroid disease Neg Hx   . Asthma Mother     Allergies  Allergen Reactions  . Penicillins    Hives Swelling     Medication list has been reviewed and updated.  Current Outpatient Prescriptions on File Prior to Visit  Medication Sig Dispense Refill  . albuterol (PROVENTIL) (5 MG/ML) 0.5% nebulizer solution Take 2.5 mg by nebulization every 6 (six) hours as needed for wheezing or shortness of breath.    . methimazole (TAPAZOLE) 10 MG tablet Take 2 tablets (20 mg total) by mouth 2 (two) times daily. (Patient taking differently: Take 20 mg by mouth 3 (three) times daily. ) 120 tablet 5   No current facility-administered medications on file prior to visit.     Review of Systems:  As per HPI- otherwise negative.  No fever, chills, nausea, vomiting, rash   Physical Examination: Vitals:   03/11/17 1225  BP: 112/72  Pulse: 95  Temp: 98.8 F (37.1 C)   Vitals:   03/11/17 1225  Weight: 113 lb 6.4 oz (51.4 kg)  Height: 5\' 3"  (1.6 m)   Body mass index is 20.09 kg/m. Ideal Body Weight: Weight in (lb) to have BMI = 25: 140.8  GEN: WDWN, NAD, Non-toxic, A & O x 3, very petite build HEENT: Atraumatic, Normocephalic. Neck supple. No masses, No LAD. Ears and Nose: No external deformity. CV: RRR, No M/G/R. No JVD. No  thrill. No extra heart sounds. PULM: CTA B, no wheezes, crackles, rhonchi. No retractions. No resp. distress. No accessory muscle use. ABD: S, NT, ND, +BS. No rebound. No HSM. EXTR: No c/c/e NEURO Normal gait.  PSYCH: Normally interactive. Conversant. Not depressed or anxious appearing.  Calm demeanor.  Pelvic: normal, no vaginal lesions or discharge. Uterus normal, no CMT, no adnexal tendereness or masses However she does have what appear to be small warts on her buttocks near the vaginal introitus- 6 on the left and 2 on the right.  Pt states that she has noted these bumps for 6- 12 months.  However she was not sure what they were, and no one mentioned them to her at her pap earlier this year.    She would like to have cryotherapy today VC obtained,  Cryotherapy  with LN on all warts x3 each.  Pt tolerated well  Assessment and Plan: Vaginal irritation - Plan: Cervicovaginal ancillary only, fluconazole (DIFLUCAN) 150 MG tablet  Genital warts  Here today with concern of vaginal irritation.  Swab collected as above.  Sx most suspicious for yeast infection so will treat with diflucan for now, Will plan further follow- up pending labs. Cryo therapy for warts. She has already had gardasil.  Return for repeat cryo in 3-4 weeks.  If this is not helpful after a few cycles will have her try Aldara cream  Signed Abbe AmsterdamJessica Andrw Mcguirt, MD

## 2017-03-13 LAB — CERVICOVAGINAL ANCILLARY ONLY
BACTERIAL VAGINITIS: NEGATIVE
CANDIDA VAGINITIS: NEGATIVE
Chlamydia: NEGATIVE
NEISSERIA GONORRHEA: NEGATIVE
TRICH (WINDOWPATH): NEGATIVE

## 2017-03-31 NOTE — Progress Notes (Deleted)
[facility] [address], [address] [place], [place] [phone]  Date:  04/02/2017   Name:  Lindsay HarborShalia Walker   DOB:  02/24/1996   MRN:  [id number]  PCP:  Pearline Cablesopland, Donye Campanelli C, MD    Chief Complaint: No chief complaint on file.   History of Present Illness:  Lindsay Walker is a 21 y.o. very pleasant female patient who presents with the following:  Seen by myself about 3 weeks ago for a routine visit and noted to have genital warts on her vulva.  We performed cryotherapy for same-   Other STI testing was negative She is generally in good health except for hyperthyroidism, current treated with suppression (methimazole) per her endocrinologist  Patient Active Problem List   Diagnosis Date Noted  . Hyperthyroidism 09/16/2015    Past Medical History:  Diagnosis Date  . Asthma   . Hyperthyroidism     No past surgical history on file.  Social History  Substance Use Topics  . Smoking status: Never Smoker  . Smokeless tobacco: Not on file  . Alcohol use No    Family History  Problem Relation Age of Onset  . Thyroid disease Neg Hx   . Asthma Mother     Allergies  Allergen Reactions  . Penicillins     Hives Swelling     Medication list has been reviewed and updated.  Current Outpatient Prescriptions on File Prior to Visit  Medication Sig Dispense Refill  . albuterol (PROVENTIL) (5 MG/ML) 0.5% nebulizer solution Take 2.5 mg by nebulization every 6 (six) hours as needed for wheezing or shortness of breath.    . MedroxyPROGESTERone Acetate 150 MG/ML SUSY     . methimazole (TAPAZOLE) 10 MG tablet Take 2 tablets (20 mg total) by mouth 2 (two) times daily. (Patient taking differently: Take 20 mg by mouth 3 (three) times daily. ) 120 tablet 5  . prednisoLONE acetate (PRED FORTE) 1 % ophthalmic suspension Place 1 drop into both eyes as needed.     No current facility-administered medications on file prior to visit.      Review of Systems:  As per HPI- otherwise negative.   Physical Examination: There were no vitals filed for this visit. There were no vitals filed for this visit. There is no height or weight on file to calculate BMI. Ideal Body Weight:    GEN: WDWN, NAD, Non-toxic, A & O x 3 HEENT: Atraumatic, Normocephalic. Neck supple. No masses, No LAD. Ears and Nose: No external deformity. CV: RRR, No M/G/R. No JVD. No thrill. No extra heart sounds. PULM: CTA B, no wheezes, crackles, rhonchi. No retractions. No resp. distress. No accessory muscle use. ABD: S, NT, ND, +BS. No rebound. No HSM. EXTR: No c/c/e NEURO Normal gait.  PSYCH: Normally interactive. Conversant. Not depressed or anxious appearing.  Calm demeanor.    Assessment and Plan: ***  Signed Abbe AmsterdamJessica Dason Mosley, MD

## 2017-04-02 ENCOUNTER — Ambulatory Visit: Payer: BLUE CROSS/BLUE SHIELD | Admitting: Family Medicine

## 2017-04-02 DIAGNOSIS — Z0289 Encounter for other administrative examinations: Secondary | ICD-10-CM

## 2017-04-11 NOTE — Progress Notes (Signed)
[facility] [address], [address] [place], [place] [address] [phone] [phone]  Date:  04/13/2017   Name:  Lindsay HarborShalia Walker   DOB:  04/07/1996   MRN:  [id number]  PCP:  Pearline Cablesopland, Lateria Alderman C, MD    Chief Complaint: Follow-up (Pt here for f/u visit. )   History of Present Illness:  Lindsay Walker is a 21 y.o. very pleasant female patient who presents with the following:  I froze some vulvar warts for her about one month ago She is here for a repeat treatment today.  She responded well to the treatment however- several of the warts have become smaller or disappeared.  She did have any complications of her last treatment   Patient Active Problem List   Diagnosis Date Noted  . Hyperthyroidism 09/16/2015    Past Medical History:  Diagnosis Date  . Asthma   . Hyperthyroidism     No past surgical history on file.  Social History  Substance Use Topics  . Smoking status: Never Smoker  . Smokeless tobacco: Not on file  . Alcohol use No    Family History  Problem Relation Age of Onset  . Thyroid disease Neg Hx   . Asthma Mother     Allergies  Allergen Reactions  . Penicillins     Hives Swelling     Medication list has been reviewed and updated.  Current Outpatient Prescriptions on File Prior to Visit  Medication Sig Dispense Refill  . albuterol (PROVENTIL) (5 MG/ML) 0.5% nebulizer solution Take 2.5 mg by nebulization every 6 (six) hours as needed for wheezing or shortness of breath.    . MedroxyPROGESTERone Acetate 150 MG/ML SUSY     . methimazole (TAPAZOLE) 10 MG tablet Take 2 tablets (20 mg total) by mouth 2 (two) times daily. (Patient taking differently: Take 20 mg by mouth 3 (three) times daily. ) 120 tablet 5  . prednisoLONE acetate (PRED FORTE) 1 % ophthalmic suspension Place 1 drop into both eyes as needed.     No current facility-administered medications on file prior to visit.     Review of Systems:  As per HPI-  otherwise negative.   Physical Examination: Vitals:   04/13/17 1101  BP: 110/70  Pulse: 86  Temp: 98 F (36.7 C)   Vitals:   04/13/17 1101  Weight: 113 lb (51.3 kg)  Height: 5\' 3"  (1.6 m)   Body mass index is 20.02 kg/m. Ideal Body Weight: Weight in (lb) to have BMI = 25: 140.8   GEN: WDWN, NAD, Non-toxic, Alert & Oriented x 3, looks well HEENT: Atraumatic, Normocephalic.  Ears and Nose: No external deformity. EXTR: No clubbing/cyanosis/edema NEURO: Normal gait.  PSYCH: Normally interactive. Conversant. Not depressed or anxious appearing.  Calm demeanor.  Froze genital warts for her again today- 2 on the right and 5 on the left inner thigh/ vulva area.   Pt tolerated well   Assessment and Plan: Genital warts  Repeat cryotherapy today Repeat in 3-4 weeks if needed   Signed Abbe AmsterdamJessica Amillion Scobee, MD

## 2017-04-13 ENCOUNTER — Ambulatory Visit (INDEPENDENT_AMBULATORY_CARE_PROVIDER_SITE_OTHER): Payer: BLUE CROSS/BLUE SHIELD | Admitting: Family Medicine

## 2017-04-13 VITALS — BP 110/70 | HR 86 | Temp 98.0°F | Ht 63.0 in | Wt 113.0 lb

## 2017-04-13 DIAGNOSIS — A630 Anogenital (venereal) warts: Secondary | ICD-10-CM | POA: Diagnosis not present

## 2017-04-13 NOTE — Patient Instructions (Signed)
It was good to see you today- your warts are looking better.  Let's plan to re-freeze in 3-4 weeks if needed

## 2017-05-14 ENCOUNTER — Ambulatory Visit: Payer: BLUE CROSS/BLUE SHIELD | Admitting: Family Medicine

## 2017-05-28 ENCOUNTER — Ambulatory Visit: Payer: Self-pay | Admitting: Family Medicine

## 2017-05-28 DIAGNOSIS — Z0289 Encounter for other administrative examinations: Secondary | ICD-10-CM

## 2017-06-02 NOTE — Progress Notes (Addendum)
[facility] [address], [address] [place], [place] [address] [phone]  Date:  06/04/2017   Name:  Lindsay Walker   DOB:  1996/05/03   MRN:  [id number]  PCP:  Pearline Cables, MD    Chief Complaint: Genital Warts (Pt comes in today wanting to be tested for HPV and states she like to discuss the process. )   History of Present Illness:  Lindsay Walker is a 21 y.o. very pleasant female patient who presents with the following:  Here today seeking an "HPV test" I have frozen genital warts for her a couple of times   Per our visit earlier this summer: She has hyperthyroidism she is on methimazole.  She takes 60 mg a day- her endocrinologist is in [place]- she cannot think of their name.  She may at some point have an ablation procedure but for now they are suppressing her thyroid medically    She has asthma- however this does not really bother her and she is not on any medication for same  She is on Depo- provera for her contraceptive Her last shot was in March- she is UTD She did have a pap early this year- it was negative She is s/p Gardasil series already She did have a pap in January, but is not quite sure what it showed.  It sounds like she may have been missing the transformation zone.  She is concerned about HPV and thinks that she may need a repeat pap Her warts are gone!   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 Hyperthyroidism 09/16/2015    Past Medical History:  Diagnosis Date  . Asthma   . Hyperthyroidism     No past surgical history on file.  Social History  Substance Use Topics  . Smoking status: Never Smoker  . Smokeless tobacco: Never Used  . Alcohol use No    Family History  Problem Relation Age of Onset  . Asthma Mother   . Thyroid disease Neg Hx     Allergies  Allergen Reactions   . Penicillins     Hives Swelling     Medication list has been reviewed and updated.  Current Outpatient Prescriptions on File Prior to Visit  Medication Sig Dispense Refill  . albuterol (PROVENTIL) (5 MG/ML) 0.5% nebulizer solution Take 2.5 mg by nebulization every 6 (six) hours as needed for wheezing or shortness of breath.    . MedroxyPROGESTERone Acetate 150 MG/ML SUSY     . methimazole (TAPAZOLE) 10 MG tablet Take 2 tablets (20 mg total) by mouth 2 (two) times daily. (Patient taking differently: Take 20 mg by mouth 3 (three) times daily. ) 120 tablet 5  . prednisoLONE acetate (PRED FORTE) 1 % ophthalmic suspension Place 1 drop into both eyes as needed.     No current facility-administered medications on file prior to visit.     Review of Systems:  As per HPI- otherwise negative.   Physical Examination: Vitals:   06/04/17 1505  BP: 118/80  Pulse: 89  Resp: 18  Temp: 98.9 F (37.2 C)  SpO2: 100%   Vitals:   06/04/17 1505  Weight: 116 lb (52.6 kg)  Height: 5\' 3"  (1.6 m)   Body mass index is 20.55 kg/m. Ideal Body Weight: Weight in (lb) to have BMI =  25: 140.8  GEN: WDWN, NAD, Non-toxic, A & O x 3, slim build, looks wlel HEENT: Atraumatic, Normocephalic. Neck supple. No masses, No LAD. Ears and Nose: No external deformity. CV: RRR, No M/G/R. No JVD. No thrill. No extra heart sounds. PULM: CTA B, no wheezes, crackles, rhonchi. No retractions. No resp. distress. No accessory muscle use. ABD: S, NT, ND, +BS. No rebound. No HSM. EXTR: No c/c/e NEURO Normal gait.  PSYCH: Normally interactive. Conversant. Not depressed or anxious appearing.  Calm demeanor.  Pelvic: normal, no vaginal lesions or discharge. Uterus normal, no CMT, no adnexal tendereness or masses Warts are cleared- pigmentation is still decreased around the areas of cryo as is expected    Assessment and Plan: Screening for cervical cancer - Plan: Cytology - PAP  Pap today- will be in touch with her  results asap Reassured that if her pap is normal, HPV testing is not needed.  Reflex to HPV if ASCUS or above She has had gardasil She states understanding and agreement with plan   Signed Abbe Amsterdam, MD  Received her pap 8/27- negative  Results for orders placed or performed in visit on 06/04/17  Cytology - PAP  Result Value Ref Range   Adequacy      Satisfactory for evaluation  endocervical/transformation zone component PRESENT.   Diagnosis      NEGATIVE FOR INTRAEPITHELIAL LESIONS OR MALIGNANCY.   Material Submitted CervicoVaginal Pap [ThinPrep Imaged]    CYTOLOGY - PAP PAP RESULT    Message to pt

## 2017-06-04 ENCOUNTER — Ambulatory Visit (INDEPENDENT_AMBULATORY_CARE_PROVIDER_SITE_OTHER): Payer: BLUE CROSS/BLUE SHIELD | Admitting: Family Medicine

## 2017-06-04 ENCOUNTER — Encounter: Payer: Self-pay | Admitting: Family Medicine

## 2017-06-04 ENCOUNTER — Other Ambulatory Visit (HOSPITAL_COMMUNITY)
Admission: RE | Admit: 2017-06-04 | Discharge: 2017-06-04 | Disposition: A | Discharge status: Home / Self Care | Payer: BLUE CROSS/BLUE SHIELD | Source: Ambulatory Visit | Attending: Family Medicine | Admitting: Family Medicine

## 2017-06-04 VITALS — BP 118/80 | HR 89 | Temp 98.9°F | Resp 18 | Ht 63.0 in | Wt 116.0 lb

## 2017-06-04 DIAGNOSIS — Z01419 Encounter for gynecological examination (general) (routine) without abnormal findings: Secondary | ICD-10-CM | POA: Diagnosis present

## 2017-06-04 DIAGNOSIS — Z124 Encounter for screening for malignant neoplasm of cervix: Secondary | ICD-10-CM | POA: Diagnosis not present

## 2017-06-04 NOTE — Patient Instructions (Signed)
It ws great to see you today- I will be in touch wiht your pap results asap

## 2017-06-08 ENCOUNTER — Encounter: Payer: Self-pay | Admitting: Family Medicine

## 2017-06-08 LAB — CYTOLOGY - PAP: Diagnosis: NEGATIVE

## 2017-07-14 ENCOUNTER — Other Ambulatory Visit: Payer: Self-pay | Admitting: Endocrinology

## 2017-10-16 ENCOUNTER — Ambulatory Visit: Payer: BLUE CROSS/BLUE SHIELD

## 2017-11-10 NOTE — Progress Notes (Signed)
error 

## 2017-11-11 ENCOUNTER — Encounter: Payer: BLUE CROSS/BLUE SHIELD | Admitting: Family Medicine

## 2017-11-11 ENCOUNTER — Encounter: Payer: Self-pay | Admitting: Family Medicine

## 2017-12-08 ENCOUNTER — Ambulatory Visit (INDEPENDENT_AMBULATORY_CARE_PROVIDER_SITE_OTHER): Payer: Managed Care, Other (non HMO) | Admitting: Family

## 2017-12-08 ENCOUNTER — Encounter: Payer: Self-pay | Admitting: Family

## 2017-12-08 ENCOUNTER — Other Ambulatory Visit (HOSPITAL_COMMUNITY)
Admission: RE | Admit: 2017-12-08 | Discharge: 2017-12-08 | Disposition: A | Discharge status: Home / Self Care | Payer: Managed Care, Other (non HMO) | Source: Ambulatory Visit | Attending: Family | Admitting: Family

## 2017-12-08 VITALS — BP 117/73 | HR 82 | Temp 99.1°F | Resp 16 | Ht 63.0 in | Wt 110.2 lb

## 2017-12-08 DIAGNOSIS — B373 Candidiasis of vulva and vagina: Secondary | ICD-10-CM | POA: Diagnosis not present

## 2017-12-08 DIAGNOSIS — J309 Allergic rhinitis, unspecified: Secondary | ICD-10-CM | POA: Diagnosis not present

## 2017-12-08 DIAGNOSIS — N760 Acute vaginitis: Secondary | ICD-10-CM | POA: Diagnosis not present

## 2017-12-08 DIAGNOSIS — N898 Other specified noninflammatory disorders of vagina: Secondary | ICD-10-CM | POA: Insufficient documentation

## 2017-12-08 DIAGNOSIS — Z114 Encounter for screening for human immunodeficiency virus [HIV]: Secondary | ICD-10-CM

## 2017-12-08 DIAGNOSIS — J4520 Mild intermittent asthma, uncomplicated: Secondary | ICD-10-CM

## 2017-12-08 MED ORDER — FLUCONAZOLE 150 MG PO TABS: 150.0000 mg | ORAL_TABLET | Freq: Once | ORAL | 0 refills | Status: AC

## 2017-12-08 MED ORDER — CETIRIZINE HCL 10 MG PO TABS: 10.0000 mg | ORAL_TABLET | Freq: Every day | ORAL | 3 refills | Status: DC

## 2017-12-08 MED ORDER — ALBUTEROL SULFATE HFA 108 (90 BASE) MCG/ACT IN AERS: 2.0000 | INHALATION_SPRAY | Freq: Four times a day (QID) | RESPIRATORY_TRACT | 0 refills | Status: DC | PRN

## 2017-12-08 NOTE — Patient Instructions (Signed)
Please complete lab work prior to leaving.  Begin diflucan for probable yeast.   Call if new/worsening symptoms or if symptoms do not improve.

## 2017-12-08 NOTE — Progress Notes (Signed)
Subjective:    Patient ID: Lindsay Walker, female    DOB: 04-10-1996, 22 y.o.   MRN: [id number]  HPI  Lindsay Walker is a 22 yr old female who presents today with chief complaint of vaginal itching. Started yesterday. Denies discharge.  Denies fever or pelvic pain. Recent unprotected sex but same partner.   Asthma- uses zyrtec and albuterol prn. Reports that generally her asthma is well controlled. Has occasional flare with season change.  Allergic rhinitis- Uses zytrec daily for allergic rhinitis. Zyrtec helps these symptoms.  Eyes itch despite zyrtec.   Review of Systems See HPI  Past Medical History:  Diagnosis Date  . Asthma   . Hyperthyroidism      Social History   Socioeconomic History  . Marital status: Single    Spouse name: Not on file  . Number of children: Not on file  . Years of education: Not on file  . Highest education level: Not on file  Social Needs  . Financial resource strain: Not on file  . Food insecurity - worry: Not on file  . Food insecurity - inability: Not on file  . Transportation needs - medical: Not on file  . Transportation needs - non-medical: Not on file  Occupational History  . Not on file  Tobacco Use  . Smoking status: Never Smoker  . Smokeless tobacco: Never Used  Substance and Sexual Activity  . Alcohol use: No    Alcohol/week: 0.0 oz  . Drug use: No  . Sexual activity: Not on file  Other Topics Concern  . Not on file  Social History Narrative  . Not on file    No past surgical history on file.  Family History  Problem Relation Age of Onset  . Asthma Mother   . Thyroid disease Neg Hx     Allergies  Allergen Reactions  . Penicillins     Hives Swelling     Current Outpatient Medications on File Prior to Visit  Medication Sig Dispense Refill  . albuterol (PROVENTIL) (5 MG/ML) 0.5% nebulizer solution Take 2.5 mg by nebulization every 6 (six) hours as needed for wheezing or shortness of breath.    . MedroxyPROGESTERone  Acetate 150 MG/ML SUSY     . methimazole (TAPAZOLE) 10 MG tablet Take 2 tablets (20 mg total) by mouth 2 (two) times daily. (Patient taking differently: Take 20 mg by mouth 3 (three) times daily. ) 120 tablet 5  . prednisoLONE acetate (PRED FORTE) 1 % ophthalmic suspension Place 1 drop into both eyes as needed.     No current facility-administered medications on file prior to visit.     BP 117/73 (BP Location: Right Arm, Patient Position: Sitting, Cuff Size: Normal)   Pulse 82   Temp 99.1 F (37.3 C) (Oral)   Resp 16   Ht 5\' 3"  (1.6 m)   Wt 110 lb 3.2 oz (50 kg)   SpO2 100%   BMI 19.52 kg/m       Objective:   Physical Exam  Constitutional: She is oriented to person, place, and time. She appears well-developed and well-nourished.  HENT:  Head: Normocephalic and atraumatic.  Cardiovascular: Normal rate, regular rhythm and normal heart sounds.  No murmur heard. Pulmonary/Chest: Effort normal and breath sounds normal. No respiratory distress. She has no wheezes.  Genitourinary: Vaginal discharge found.  Musculoskeletal: She exhibits no edema.  Neurological: She is alert and oriented to person, place, and time.  Psychiatric: She has a normal mood and affect.  Her behavior is normal. Judgment and thought content normal.          Assessment & Plan:  Vaginitis- suspect yeast, will rx with diflucan. Swab obtained and will be sent for GC/Chlamydia, yeast bacteria trich, will also send for hiv screening. Discussed importance of safe sex.  Asthma- stable, refill sent for albuterol inhaler.  Allergic rhinitis- stable on zytrec but has chronic eye itching. She is allergic to dog and does not wish to remove dog from the home We discussed washing hands after petting dog.

## 2017-12-09 LAB — CERVICOVAGINAL ANCILLARY ONLY
BACTERIAL VAGINITIS: NEGATIVE
Candida vaginitis: POSITIVE — AB
Chlamydia: NEGATIVE
Neisseria Gonorrhea: NEGATIVE
TRICH (WINDOWPATH): NEGATIVE

## 2017-12-11 ENCOUNTER — Other Ambulatory Visit: Payer: Self-pay | Admitting: Family

## 2017-12-11 ENCOUNTER — Encounter: Payer: Self-pay | Admitting: Family

## 2017-12-11 NOTE — Telephone Encounter (Signed)
Called in rx for diflucan 150mg  PO x 1.

## 2018-02-01 NOTE — Progress Notes (Addendum)
[facility] [address], [address] [place], [place] [address] [phone] [phone]  Date:  02/03/2018   Name:  Lindsay HarborShalia Walker   DOB:  10/27/1995   MRN:  [id number]  PCP:  Pearline Cablesopland, Jessica C, MD    Chief Complaint: Vaginal Itching   History of Present Illness:  Lindsay HarborShalia Walker is a 22 y.o. very pleasant female patient who presents with the following:  History of hyperthyroidism- she takes methimazole but has not seen Dr. Everardo AllEllison in some time.  She plans to follow-up with him soon Here today with concern of vaginal irritation She was seen for vaginitis in February by Efraim KaufmannMelissa, tested positive for yeast, otherwise all negative.  I froze off some genital warts for her last year as well She is here today with concern of vaginal itching off and on for the last couple of months   She was treated with diflucan back in February and her sx did go away but then came back  No unusual discharge, no pain She is on depo No currently having sex - she had a negative gc/cmz back in February and no intercourse since then  No urinary sx She is otherwise feeling good  She had a cold last week- since then she has noted a bit of lymphadenopathy around her neck- she was not sure if this was ok Also she has a skin tag on the back of her neck which bothers her, she would like removed at some time.   She is working 2 jobs and will graduate from [employer] next month.  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 Hyperthyroidism 09/16/2015    Past Medical History:  Diagnosis Date  . Asthma   . Hyperthyroidism     History reviewed. No pertinent surgical history.  Social History   Tobacco Use  . Smoking status: Never Smoker  . Smokeless tobacco: Never Used  Substance Use Topics  . Alcohol use: No    Alcohol/week: 0.0 oz  . Drug use: No    Family History  Problem Relation Age of Onset  . Asthma  Mother   . Thyroid disease Neg Hx     Allergies  Allergen Reactions  . Penicillins     Hives Swelling     Medication list has been reviewed and updated.  Current Outpatient Medications on File Prior to Visit  Medication Sig Dispense Refill  . albuterol (PROVENTIL HFA;VENTOLIN HFA) 108 (90 Base) MCG/ACT inhaler Inhale 2 puffs into the lungs every 6 (six) hours as needed for wheezing or shortness of breath. 1 Inhaler 0  . albuterol (PROVENTIL) (5 MG/ML) 0.5% nebulizer solution Take 2.5 mg by nebulization every 6 (six) hours as needed for wheezing or shortness of breath.    . cetirizine (ZYRTEC) 10 MG tablet Take 1 tablet (10 mg total) by mouth daily. 90 tablet 3  . MedroxyPROGESTERone Acetate 150 MG/ML SUSY     . methimazole (TAPAZOLE) 10 MG tablet Take 2 tablets (20 mg total) by mouth 2 (two) times daily. (Patient taking differently: Take 20 mg by mouth 3 (three) times daily. ) 120 tablet 5  . prednisoLONE acetate (PRED FORTE) 1 % ophthalmic suspension Place 1 drop into both eyes as needed.     No current facility-administered medications on file prior to visit.     Review of Systems:  As per HPI- otherwise negative.   Physical  Examination: Vitals:   02/03/18 1335  BP: 102/70  Pulse: (!) 103  Resp: 16  Temp: 98.7 F (37.1 C)  SpO2: 99%   Vitals:   02/03/18 1335  Weight: 114 lb (51.7 kg)  Height: 5\' 3"  (1.6 m)   Body mass index is 20.19 kg/m. Ideal Body Weight: Weight in (lb) to have BMI = 25: 140.8  GEN: WDWN, NAD, Non-toxic, A & O x 3, petite build, looks well  HEENT: Atraumatic, Normocephalic. Neck supple. No LAD.  Bilateral TM wnl, oropharynx normal.  PEERL,EOMI.   Small left mandibular lymph node, small goiter  Ears and Nose: No external deformity. CV: RRR, No M/G/R. No JVD. No thrill. No extra heart sounds. PULM: CTA B, no wheezes, crackles, rhonchi. No retractions. No resp. distress. No accessory muscle use. ABD: S, NT, ND, +BS. No rebound. No HSM. EXTR: No  c/c/e NEURO Normal gait.  PSYCH: Normally interactive. Conversant. Not depressed or anxious appearing.  Calm demeanor.  Pelvic: normal, no vaginal lesions, slight whitish discharge suggestive of yeast vaginitis. Uterus normal, no CMT, no adnexal tendereness or masses     Assessment and Plan: Monilia infection - Plan: Hemoglobin A1c, Comprehensive metabolic panel, fluconazole (DIFLUCAN) 150 MG tablet  Lymphadenopathy - Plan: HIV antibody, CBC  Vaginal discharge  Hyperthyroidism - Plan: TSH, T4, free  Vaginal itching - Plan: Cervicovaginal ancillary only, fluconazole (DIFLUCAN) 150 MG tablet  A few issues today ? Recurrent monilia.  Will treat with diflucan weekly for 4 weeks.  HIV, CBC, pending She is seeing Dr. Everardo All soon so we will get thyroid labs today in hopes of avoiding another stick Will plan further follow- up pending labs.    Signed Abbe Amsterdam, MD  Received her labs so far 4/26, message to pt HIV is negative as expected Both thyroid levels that we ran are normal- good news Blood count is normal Metabolic profile is normal Still waiting on your vaginal swab but I will be in touch with this asap  Your A1c- average blood sugar- is at the cut off for pre-diabetes.  We will monitor this for you, as you may have a genetic pre-disposition to diabetes.  However please try not to worry about it too much- normal is 5.6 and you are at 5.7, it may well be normal the next time we check.  Certainly you are not overweight! Results for orders placed or performed in visit on 02/03/18  Hemoglobin A1c  Result Value Ref Range   Hgb A1c MFr Bld 5.7 4.6 - 6.5 %  HIV antibody  Result Value Ref Range   HIV 1&2 Ab, 4th Generation NON-REACTIVE NON-REACTI  TSH  Result Value Ref Range   TSH 0.61 0.35 - 4.50 uIU/mL  T4, free  Result Value Ref Range   Free T4 0.76 0.60 - 1.60 ng/dL  CBC  Result Value Ref Range   WBC 4.3 4.0 - 10.5 K/uL   RBC 4.69 3.87 - 5.11 Mil/uL   Platelets  184.0 150.0 - 400.0 K/uL   Hemoglobin 12.2 12.0 - 15.0 g/dL   HCT 16.1 09.6 - 04.5 %   MCV 81.6 78.0 - 100.0 fl   MCHC 31.9 30.0 - 36.0 g/dL   RDW 40.9 81.1 - 91.4 %  Comprehensive metabolic panel  Result Value Ref Range   Sodium 144 135 - 145 mEq/L   Potassium 4.2 3.5 - 5.1 mEq/L   Chloride 108 96 - 112 mEq/L   CO2 27 19 - 32 mEq/L   Glucose,  Bld 70 70 - 99 mg/dL   BUN 12 6 - 23 mg/dL   Creatinine, Ser 1.61 0.40 - 1.20 mg/dL   Total Bilirubin 0.4 0.2 - 1.2 mg/dL   Alkaline Phosphatase 41 39 - 117 U/L   AST 11 0 - 37 U/L   ALT 6 0 - 35 U/L   Total Protein 7.7 6.0 - 8.3 g/dL   Albumin 4.5 3.5 - 5.2 g/dL   Calcium 9.9 8.4 - 09.6 mg/dL   GFR 045.40 >98.11 mL/min     Received multiswab as well 4/27- message to pt Bacterial vaginitis Negative for Bacterial Vaginitis Microorganisms   Comment: Normal Reference Range - Negative  Candida vaginitis Negative for Candida species   Comment: Normal Reference Range - Negative  Trichomonas Negative   Comment: Normal Reference Range - Negative

## 2018-02-03 ENCOUNTER — Other Ambulatory Visit (HOSPITAL_COMMUNITY)
Admission: RE | Admit: 2018-02-03 | Discharge: 2018-02-03 | Disposition: A | Discharge status: Home / Self Care | Payer: Managed Care, Other (non HMO) | Source: Ambulatory Visit | Attending: Family Medicine | Admitting: Family Medicine

## 2018-02-03 ENCOUNTER — Encounter: Payer: Self-pay | Admitting: Family Medicine

## 2018-02-03 ENCOUNTER — Ambulatory Visit (INDEPENDENT_AMBULATORY_CARE_PROVIDER_SITE_OTHER): Payer: Managed Care, Other (non HMO) | Admitting: Family Medicine

## 2018-02-03 VITALS — BP 102/70 | HR 103 | Temp 98.7°F | Resp 16 | Ht 63.0 in | Wt 114.0 lb

## 2018-02-03 DIAGNOSIS — N898 Other specified noninflammatory disorders of vagina: Secondary | ICD-10-CM | POA: Insufficient documentation

## 2018-02-03 DIAGNOSIS — R591 Generalized enlarged lymph nodes: Secondary | ICD-10-CM | POA: Diagnosis not present

## 2018-02-03 DIAGNOSIS — B379 Candidiasis, unspecified: Secondary | ICD-10-CM | POA: Diagnosis not present

## 2018-02-03 DIAGNOSIS — E0590 Thyrotoxicosis, unspecified without thyrotoxic crisis or storm: Secondary | ICD-10-CM

## 2018-02-03 MED ORDER — FLUCONAZOLE 150 MG PO TABS: 150.0000 mg | ORAL_TABLET | Freq: Once | ORAL | 0 refills | Status: DC

## 2018-02-03 NOTE — Patient Instructions (Signed)
Good to see you today-  I will be in touch with your labs asap  I sent in diflucan for you to use weekly for 4weeks. We will check blood work for you today as well Please come and see me to have the skin tag on your neck removed at your convenience.   If your lymph nodes do not resolve in about 2 weeks please let me know

## 2018-02-04 LAB — CBC
HEMATOCRIT: 38.3 % (ref 36.0–46.0)
Hemoglobin: 12.2 g/dL (ref 12.0–15.0)
MCHC: 31.9 g/dL (ref 30.0–36.0)
MCV: 81.6 fl (ref 78.0–100.0)
PLATELETS: 184 10*3/uL (ref 150.0–400.0)
RBC: 4.69 Mil/uL (ref 3.87–5.11)
RDW: 15 % (ref 11.5–15.5)
WBC: 4.3 10*3/uL (ref 4.0–10.5)

## 2018-02-04 LAB — COMPREHENSIVE METABOLIC PANEL
ALBUMIN: 4.5 g/dL (ref 3.5–5.2)
ALK PHOS: 41 U/L (ref 39–117)
ALT: 6 U/L (ref 0–35)
AST: 11 U/L (ref 0–37)
BUN: 12 mg/dL (ref 6–23)
CALCIUM: 9.9 mg/dL (ref 8.4–10.5)
CO2: 27 mEq/L (ref 19–32)
CREATININE: 0.8 mg/dL (ref 0.40–1.20)
Chloride: 108 mEq/L (ref 96–112)
GFR: 115.05 mL/min (ref >60.00)
Glucose, Bld: 70 mg/dL (ref 70–99)
POTASSIUM: 4.2 meq/L (ref 3.5–5.1)
Sodium: 144 mEq/L (ref 135–145)
TOTAL PROTEIN: 7.7 g/dL (ref 6.0–8.3)
Total Bilirubin: 0.4 mg/dL (ref 0.2–1.2)

## 2018-02-04 LAB — HIV ANTIBODY (ROUTINE TESTING W REFLEX): HIV 1&2 Ab, 4th Generation: NONREACTIVE

## 2018-02-04 LAB — TSH: TSH: 0.61 u[IU]/mL (ref 0.35–4.50)

## 2018-02-04 LAB — T4, FREE: Free T4: 0.76 ng/dL (ref 0.60–1.60)

## 2018-02-04 LAB — HEMOGLOBIN A1C: Hgb A1c MFr Bld: 5.7 % (ref 4.6–6.5)

## 2018-02-05 ENCOUNTER — Encounter: Payer: Self-pay | Admitting: Family Medicine

## 2018-02-05 LAB — CERVICOVAGINAL ANCILLARY ONLY
BACTERIAL VAGINITIS: NEGATIVE
Candida vaginitis: NEGATIVE
TRICH (WINDOWPATH): NEGATIVE

## 2018-02-06 ENCOUNTER — Encounter: Payer: Self-pay | Admitting: Family Medicine

## 2018-02-08 NOTE — Progress Notes (Deleted)
[facility] [address] [place], [place] [address] [phone]  Date:  02/10/2018   Name:  Lindsay Walker   DOB:  [birth year]   MRN:  [id number]  PCP:  Pearline Cables, MD    Chief Complaint: No chief complaint on file.   History of Present Illness:  Lindsay Walker is a 22 y.o. very pleasant female patient who presents with the following:  From our last visit about 10 days ago: History of hyperthyroidism- she takes methimazole but has not seen Dr. Everardo All in some time.  She plans to follow-up with him soon Here today with concern of vaginal irritation She was seen for vaginitis in February by Efraim Kaufmann, tested positive for yeast, otherwise all negative.  I froze off some genital warts for her last year as well She is here today with concern of vaginal itching off and on for the last couple of months  She was treated with diflucan back in February and her sx did go away but then came back  No unusual discharge, no pain She is on depo Not currently having sex - she had a negative gc/cmz back in February and no intercourse since then No urinary sx She is otherwise feeling good  She had a cold last week- since then she has noted a bit of lymphadenopathy around her neck- she was not sure if this was ok Also she has a skin tag on the back of her neck which bothers her, she would like removed at some time.  She is working 2 jobs and will graduate from [employer] next month.  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 Hyperthyroidism 09/16/2015    Past Medical History:  Diagnosis Date  . Asthma   . Hyperthyroidism     No past surgical history on file.  Social History   Tobacco Use  . Smoking status: Never Smoker  . Smokeless tobacco: Never Used  Substance Use Topics  .  Alcohol use: No    Alcohol/week: 0.0 oz  . Drug use: No    Family History  Problem Relation Age of Onset  . Asthma Mother   . Thyroid disease Neg Hx     Allergies  Allergen Reactions  . Penicillins     Hives Swelling     Medication list has been reviewed and updated.  Current Outpatient Medications on File Prior to Visit  Medication Sig Dispense Refill  . albuterol (PROVENTIL HFA;VENTOLIN HFA) 108 (90 Base) MCG/ACT inhaler Inhale 2 puffs into the lungs every 6 (six) hours as needed for wheezing or shortness of breath. 1 Inhaler 0  . albuterol (PROVENTIL) (5 MG/ML) 0.5% nebulizer solution Take 2.5 mg by nebulization every 6 (six) hours as needed for wheezing or shortness of breath.    . cetirizine (ZYRTEC) 10 MG tablet Take 1 tablet (10 mg total) by mouth daily. 90 tablet 3  . MedroxyPROGESTERone Acetate 150 MG/ML SUSY     . methimazole (TAPAZOLE) 10 MG tablet Take 2 tablets (20 mg total) by mouth 2 (two) times daily. (Patient taking differently: Take 20 mg by mouth 3 (three) times daily. ) 120 tablet 5  . prednisoLONE acetate (PRED FORTE) 1 % ophthalmic suspension Place 1 drop into both eyes as needed.  No current facility-administered medications on file prior to visit.     Review of Systems:  ***  Physical Examination: There were no vitals filed for this visit. There were no vitals filed for this visit. There is no height or weight on file to calculate BMI. Ideal Body Weight:    ***  Assessment and Plan: ***  Signed Abbe Amsterdam, MD

## 2018-02-10 ENCOUNTER — Ambulatory Visit: Payer: Managed Care, Other (non HMO) | Admitting: Family Medicine

## 2018-02-10 ENCOUNTER — Encounter: Payer: Self-pay | Admitting: Family Medicine

## 2018-02-18 ENCOUNTER — Ambulatory Visit: Payer: Managed Care, Other (non HMO) | Admitting: Endocrinology

## 2018-03-15 ENCOUNTER — Telehealth: Payer: Self-pay | Admitting: Family Medicine

## 2018-03-15 NOTE — Telephone Encounter (Signed)
Copied from CRM [id number]. Topic: Quick Communication - See Telephone Encounter >> Mar 15, 2018 11:29 AM Rudi CocoLathan, Sandon Yoho M, NT wrote: CRM for notification. See Telephone encounter for: 03/15/18.  Pt. Calling request refill on depo shot and would like for nurse to give her a call

## 2018-03-16 NOTE — Telephone Encounter (Signed)
Please advise if ok for patient to come in for depo shot. Patient no showed last appointment.

## 2018-03-16 NOTE — Telephone Encounter (Signed)
That is fine, but when was her last shot?  I can't seem to find the date.  If she is out of her window she will need to see a provider and have a pregnancy test and discussion prior to shot Thanks! JC

## 2018-03-17 NOTE — Telephone Encounter (Signed)
Patient coming in tomorrow for office visit. Last depo injection 11/22/17.

## 2018-03-18 ENCOUNTER — Encounter: Payer: Self-pay | Admitting: Family Medicine

## 2018-03-18 ENCOUNTER — Ambulatory Visit (INDEPENDENT_AMBULATORY_CARE_PROVIDER_SITE_OTHER): Payer: Managed Care, Other (non HMO) | Admitting: Family Medicine

## 2018-03-18 VITALS — BP 108/70 | HR 90 | Resp 16 | Ht 63.0 in | Wt 109.8 lb

## 2018-03-18 DIAGNOSIS — Z789 Other specified health status: Secondary | ICD-10-CM | POA: Diagnosis not present

## 2018-03-18 DIAGNOSIS — IMO0001 Reserved for inherently not codable concepts without codable children: Secondary | ICD-10-CM

## 2018-03-18 LAB — POCT URINE PREGNANCY: PREG TEST UR: NEGATIVE

## 2018-03-18 MED ORDER — MEDROXYPROGESTERONE ACETATE 150 MG/ML IM SUSP
150.0000 mg | Freq: Once | INTRAMUSCULAR | Status: AC
  Administered 2018-03-18: 150 mg via INTRAMUSCULAR

## 2018-03-18 NOTE — Progress Notes (Signed)
[facility] [address] [place], [place] [address] [phone]  Date:  03/18/2018   Name:  Lindsay HarborShalia Walker   DOB:  04/04/1996   MRN:  [id number]  PCP:  Pearline Cablesopland, Clorinda Wyble C, MD    Chief Complaint: Depo Injection (out of window, needing pregnancy test)   History of Present Illness:  Lindsay HarborShalia Walker is a 22 y.o. very pleasant female patient who presents with the following:  She has been on Depo-Provera but was getting her shot elsewhere; at her school Her last shot was given 11/14/17 per her report. She has this logged in her phone Today is Thursday. Last intercourse was this past Sunday- they are not using any protection like condoms  She has been on depo for years, since 2012 No recent menses  She is otherwise feeling well today and has no other concerns   Pulse Readings from Last 3 Encounters:  03/18/18 (!) 118  02/03/18 (!) 103  12/08/17 82     Patient Active Problem List   Diagnosis Date Noted  . Hyperthyroidism 09/16/2015    Past Medical History:  Diagnosis Date  . Asthma   . Hyperthyroidism     No past surgical history on file.  Social History   Tobacco Use  . Smoking status: Never Smoker  . Smokeless tobacco: Never Used  Substance Use Topics  . Alcohol use: No    Alcohol/week: 0.0 oz  . Drug use: No    Family History  Problem Relation Age of Onset  . Asthma Mother   . Thyroid disease Neg Hx     Allergies  Allergen Reactions  . Penicillins     Hives Swelling     Medication list has been reviewed and updated.  Current Outpatient Medications on File Prior to Visit  Medication Sig Dispense Refill  . albuterol (PROVENTIL HFA;VENTOLIN HFA) 108 (90 Base) MCG/ACT inhaler Inhale 2 puffs into the lungs every 6 (six) hours as needed for wheezing or shortness of breath. 1 Inhaler 0  . albuterol (PROVENTIL) (5 MG/ML) 0.5% nebulizer solution Take 2.5 mg by nebulization every 6 (six) hours as needed  for wheezing or shortness of breath.    . cetirizine (ZYRTEC) 10 MG tablet Take 1 tablet (10 mg total) by mouth daily. 90 tablet 3  . MedroxyPROGESTERone Acetate 150 MG/ML SUSY     . methimazole (TAPAZOLE) 10 MG tablet Take 2 tablets (20 mg total) by mouth 2 (two) times daily. (Patient taking differently: Take 20 mg by mouth 3 (three) times daily. ) 120 tablet 5  . prednisoLONE acetate (PRED FORTE) 1 % ophthalmic suspension Place 1 drop into both eyes as needed.     No current facility-administered medications on file prior to visit.     Review of Systems:  As per HPI- otherwise negative.   Physical Examination: Vitals:   03/18/18 1428  BP: 108/70  Pulse: (!) 118  Resp: 16  SpO2: 97%   Vitals:   03/18/18 1428  Weight: 109 lb 12.8 oz (49.8 kg)  Height: 5\' 3"  (1.6 m)   Body mass index is 19.45 kg/m. Ideal Body Weight: Weight in (lb) to have BMI = 25: 140.8  GEN: WDWN, NAD, Non-toxic, A & O x 3, slight build, looks well  HEENT: Atraumatic, Normocephalic. Neck supple. No masses, No LAD. Ears and Nose: No external deformity. CV: RRR, No M/G/R. No JVD. No thrill. No extra heart sounds. PULM: CTA B, no  wheezes, crackles, rhonchi. No retractions. No resp. distress. No accessory muscle use. EXTR: No c/c/e NEURO Normal gait.  PSYCH: Normally interactive. Conversant. Not depressed or anxious appearing.  Calm demeanor.   Results for orders placed or performed in visit on 03/18/18  POCT urine pregnancy  Result Value Ref Range   Preg Test, Ur Negative Negative    Assessment and Plan: Contraception - Plan: POCT urine pregnancy, medroxyPROGESTERone (DEPO-PROVERA) injection 150 mg  Pt wishes to get a depo shot today She is significantly out of her window. Negative HCG today Discussed plan B, she declines but would like to go ahead with shot today. She understands that pregnancy test is not conclusive She will use a barrier contraceptive for 2 weeks and take home hcg in 2 weeks She  will let me know if any concerns    Signed Abbe Amsterdam, MD

## 2018-03-18 NOTE — Patient Instructions (Addendum)
It was nice to see you today- take care and we will see you for your next depo shot when due.  Please use a barrier method of contraception for 2 weeks, and take a home pregnancy test in 2 weeks.  Assuming test is negative come and see us for your next shot 8/22- 06/17/18  Take care!

## 2018-06-09 ENCOUNTER — Ambulatory Visit: Payer: Managed Care, Other (non HMO) | Admitting: Family Medicine

## 2018-06-12 NOTE — Progress Notes (Signed)
[facility] [address] [place], [place] [address] [phone]  Date:  06/16/2018   Name:  Lindsay Walker   DOB:  [birth year]   MRN:  [id number]  PCP:  Pearline Cables, MD    Chief Complaint: Contraception (would like to come off of depo)   History of Present Illness:  Lindsay Walker is a 22 y.o. very pleasant female patient who presents with the following:  Has been on Depo but would like to possibly use a different method at this time She has been on Depo for 7 years She does not wish to use pills however We discussed other options including patch, ring, implant- she is most interested in an IUD- however I cannot place this for her, she will need to see GYN if she decided to go with this option  She would like to get depo after all today so she can think about an IUD She does not bleed on depo Otherwise generally in good health  Her last shot was on 03/18/18 so she is in her window. Ok to have shot today  Window 8/22 to 9/5 Patient Active Problem List   Diagnosis Date Noted  . Hyperthyroidism 09/16/2015    Past Medical History:  Diagnosis Date  . Asthma   . Hyperthyroidism     No past surgical history on file.  Social History   Tobacco Use  . Smoking status: Never Smoker  . Smokeless tobacco: Never Used  Substance Use Topics  . Alcohol use: No    Alcohol/week: 0.0 standard drinks  . Drug use: No    Family History  Problem Relation Age of Onset  . Asthma Mother   . Thyroid disease Neg Hx     Allergies  Allergen Reactions  . Penicillins     Hives Swelling     Medication list has been reviewed and updated.  Current Outpatient Medications on File Prior to Visit  Medication Sig Dispense Refill  . albuterol (PROVENTIL) (5 MG/ML) 0.5% nebulizer solution Take 2.5 mg by nebulization every 6 (six) hours as needed for wheezing or shortness of breath.    . cetirizine (ZYRTEC) 10 MG tablet Take 1  tablet (10 mg total) by mouth daily. 90 tablet 3  . MedroxyPROGESTERone Acetate 150 MG/ML SUSY     . methimazole (TAPAZOLE) 10 MG tablet Take 2 tablets (20 mg total) by mouth 2 (two) times daily. (Patient taking differently: Take 20 mg by mouth 3 (three) times daily. ) 120 tablet 5  . prednisoLONE acetate (PRED FORTE) 1 % ophthalmic suspension Place 1 drop into both eyes as needed.    . VENTOLIN HFA 108 (90 Base) MCG/ACT inhaler INHALE 2 PUFFS INTO THE LUNGS EVERY 6 HOURS AS NEEDED FOR WHEEZING OR SHORTNESS OF BREATH 18 g 3   No current facility-administered medications on file prior to visit.     Review of Systems:  As per HPI- otherwise negative.   Physical Examination: Vitals:   06/16/18 1126  BP: 102/68  Pulse: 61  Resp: 16  Temp: 98.6 F (37 C)   Vitals:   06/16/18 1126  Weight: 110 lb (49.9 kg)  Height: 5\' 3"  (1.6 m)   Body mass index is 19.49 kg/m. Ideal Body Weight: Weight in (lb) to have BMI = 25: 140.8  GEN: WDWN, NAD, Non-toxic, A & O x 3, petite build, looks well  HEENT: Atraumatic, Normocephalic. Neck supple. No masses, No LAD.  Ears and Nose: No external deformity. CV: RRR, No M/G/R. No JVD. No thrill. No extra heart sounds. PULM: CTA B, no wheezes, crackles, rhonchi. No retractions. No resp. distress. No accessory muscle use. EXTR: No c/c/e NEURO Normal gait.  PSYCH: Normally interactive. Conversant. Not depressed or anxious appearing.  Calm demeanor.    Assessment and Plan: Encounter for surveillance of injectable contraceptive - Plan: medroxyPROGESTERone (DEPO-PROVERA) injection 150 mg  Depo give, gave hand- out regarding IUD She will think about this and let me know if she would like a referral to OBG  Signed Abbe AmsterdamJessica Copland, MD

## 2018-06-15 ENCOUNTER — Other Ambulatory Visit: Payer: Self-pay | Admitting: Family

## 2018-06-16 ENCOUNTER — Encounter: Payer: Self-pay | Admitting: Family Medicine

## 2018-06-16 ENCOUNTER — Ambulatory Visit (INDEPENDENT_AMBULATORY_CARE_PROVIDER_SITE_OTHER): Payer: Managed Care, Other (non HMO) | Admitting: Family Medicine

## 2018-06-16 VITALS — BP 102/68 | HR 61 | Temp 98.6°F | Resp 16 | Ht 63.0 in | Wt 110.0 lb

## 2018-06-16 DIAGNOSIS — Z3042 Encounter for surveillance of injectable contraceptive: Secondary | ICD-10-CM

## 2018-06-16 MED ORDER — MEDROXYPROGESTERONE ACETATE 150 MG/ML IM SUSP
150.0000 mg | Freq: Once | INTRAMUSCULAR | Status: AC
  Administered 2018-06-16: 150 mg via INTRAMUSCULAR

## 2018-06-16 NOTE — Patient Instructions (Signed)
Good to see you today as always!   You got your depo shot today Let me know if you would like to have an IUD placed- this would be done by an OB-GYN and I can refer you if needed  If you choose to stay on depo, your next shot will be due 11/20 to 12/4

## 2018-07-29 ENCOUNTER — Other Ambulatory Visit: Payer: Self-pay | Admitting: Family Medicine

## 2018-07-29 DIAGNOSIS — N898 Other specified noninflammatory disorders of vagina: Secondary | ICD-10-CM

## 2018-07-29 DIAGNOSIS — B379 Candidiasis, unspecified: Secondary | ICD-10-CM

## 2018-07-30 ENCOUNTER — Encounter: Payer: Self-pay | Admitting: Family Medicine

## 2018-07-30 NOTE — Telephone Encounter (Signed)
Called her and [facility]- please provide details about request for diflucan

## 2018-08-01 ENCOUNTER — Encounter (HOSPITAL_BASED_OUTPATIENT_CLINIC_OR_DEPARTMENT_OTHER): Payer: Self-pay | Admitting: Emergency Medicine

## 2018-08-01 ENCOUNTER — Other Ambulatory Visit: Payer: Self-pay

## 2018-08-01 ENCOUNTER — Emergency Department (HOSPITAL_BASED_OUTPATIENT_CLINIC_OR_DEPARTMENT_OTHER): Payer: Managed Care, Other (non HMO)

## 2018-08-01 ENCOUNTER — Emergency Department (HOSPITAL_BASED_OUTPATIENT_CLINIC_OR_DEPARTMENT_OTHER)
Admission: EM | Admit: 2018-08-01 | Discharge: 2018-08-01 | Disposition: A | Discharge status: Home / Self Care | Payer: Managed Care, Other (non HMO) | Attending: Emergency Medicine | Admitting: Emergency Medicine

## 2018-08-01 DIAGNOSIS — Z79899 Other long term (current) drug therapy: Secondary | ICD-10-CM | POA: Diagnosis not present

## 2018-08-01 DIAGNOSIS — Y998 Other external cause status: Secondary | ICD-10-CM | POA: Insufficient documentation

## 2018-08-01 DIAGNOSIS — S29012A Strain of muscle and tendon of back wall of thorax, initial encounter: Secondary | ICD-10-CM | POA: Diagnosis not present

## 2018-08-01 DIAGNOSIS — Y9389 Activity, other specified: Secondary | ICD-10-CM | POA: Diagnosis not present

## 2018-08-01 DIAGNOSIS — S299XXA Unspecified injury of thorax, initial encounter: Secondary | ICD-10-CM | POA: Diagnosis present

## 2018-08-01 DIAGNOSIS — Y92410 Unspecified street and highway as the place of occurrence of the external cause: Secondary | ICD-10-CM | POA: Diagnosis not present

## 2018-08-01 DIAGNOSIS — J45909 Unspecified asthma, uncomplicated: Secondary | ICD-10-CM | POA: Diagnosis not present

## 2018-08-01 DIAGNOSIS — T148XXA Other injury of unspecified body region, initial encounter: Secondary | ICD-10-CM

## 2018-08-01 MED ORDER — CYCLOBENZAPRINE HCL 10 MG PO TABS: 10.0000 mg | ORAL_TABLET | Freq: Two times a day (BID) | ORAL | 0 refills | Status: DC | PRN
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
## 2018-08-01 NOTE — Discharge Instructions (Addendum)
Take alleve and the muscle relaxant as needed, the symptoms should improve over the next several days to week
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
## 2018-09-14 ENCOUNTER — Ambulatory Visit: Payer: Managed Care, Other (non HMO)

## 2018-09-26 NOTE — Progress Notes (Signed)
[facility] at [facility] [address], [address] [place], [place] [address] [phone]  Date:  09/29/2018   Name:  Lindsay Walker   DOB:  10/21/1995   MRN:  [id number]  PCP:  Pearline Cables, MD    Chief Complaint: Contraception (depo shot)   History of Present Illness:  Lindsay Walker is a 22 y.o. very pleasant female patient who presents with the following:  Following up for contraceptive needs today Recently missed her depo window- ended on 12/9 Shot given 06/16/18 No menses  Last intercourse was about 4 weeks ago She would like to do her shot today  Patient Active Problem List   Diagnosis Date Noted  . Hyperthyroidism 09/16/2015    Past Medical History:  Diagnosis Date  . Asthma   . Hyperthyroidism     No past surgical history on file.  Social History   Tobacco Use  . Smoking status: Never Smoker  . Smokeless tobacco: Never Used  Substance Use Topics  . Alcohol use: Yes    Alcohol/week: 0.0 standard drinks  . Drug use: No    Family History  Problem Relation Age of Onset  . Asthma Mother   . Thyroid disease Neg Hx     Allergies  Allergen Reactions  . Penicillins     Hives Swelling     Medication list has been reviewed and updated.  Current Outpatient Medications on File Prior to Visit  Medication Sig Dispense Refill  . albuterol (PROVENTIL) (5 MG/ML) 0.5% nebulizer solution Take 2.5 mg by nebulization every 6 (six) hours as needed for wheezing or shortness of breath.    . cetirizine (ZYRTEC) 10 MG tablet Take 1 tablet (10 mg total) by mouth daily. 90 tablet 3  . MedroxyPROGESTERone Acetate 150 MG/ML SUSY     . methimazole (TAPAZOLE) 10 MG tablet Take 2 tablets (20 mg total) by mouth 2 (two) times daily. (Patient taking differently: Take 20 mg by mouth 3 (three) times daily. ) 120 tablet 5  . VENTOLIN HFA 108 (90 Base) MCG/ACT inhaler INHALE 2 PUFFS INTO THE LUNGS EVERY 6 HOURS AS NEEDED FOR WHEEZING OR  SHORTNESS OF BREATH 18 g 3   No current facility-administered medications on file prior to visit.     Review of Systems:  As per HPI- otherwise negative.   Physical Examination: Vitals:   09/29/18 1132  BP: 108/76  Pulse: 81  Resp: 16  SpO2: 100%   Vitals:   09/29/18 1132  Weight: 110 lb (49.9 kg)  Height: 5\' 3"  (1.6 m)   Body mass index is 19.49 kg/m. Ideal Body Weight: Weight in (lb) to have BMI = 25: 140.8  GEN: WDWN, NAD, Non-toxic, A & O x 3.  Slim build, looks well. HEENT: Atraumatic, Normocephalic. Neck supple. No masses, No LAD. Ears and Nose: No external deformity. CV: RRR, No M/G/R. No JVD. No thrill. No extra heart sounds. PULM: CTA B, no wheezes, crackles, rhonchi. No retractions. No resp. distress. No accessory muscle use.  EXTR: No c/c/e NEURO Normal gait.  PSYCH: Normally interactive. Conversant. Not depressed or anxious appearing.  Calm demeanor.  Results for orders placed or performed in visit on 09/29/18  POCT urine pregnancy  Result Value Ref Range   Preg Test, Ur Negative Negative     Assessment and Plan: Encounter for surveillance of injectable contraceptive - Plan: POCT urine pregnancy, medroxyPROGESTERone (DEPO-PROVERA) injection 150 mg  Here today to catch up on  her Depo-Provera shot.  She is outside of her window, so one pregnancy test was performed.  This is negative, and it has been about 1 month since she had intercourse. we can feel confident she is not pregnant and gave her shot today. She does have a perpetual calendar at home to reference for her neck shot.  Signed Abbe AmsterdamJessica Copland, MD

## 2018-09-29 ENCOUNTER — Encounter: Payer: Self-pay | Admitting: Family Medicine

## 2018-09-29 ENCOUNTER — Ambulatory Visit (INDEPENDENT_AMBULATORY_CARE_PROVIDER_SITE_OTHER): Payer: Managed Care, Other (non HMO) | Admitting: Family Medicine

## 2018-09-29 VITALS — BP 108/76 | HR 81 | Resp 16 | Ht 63.0 in | Wt 110.0 lb

## 2018-09-29 DIAGNOSIS — Z3042 Encounter for surveillance of injectable contraceptive: Secondary | ICD-10-CM | POA: Diagnosis not present

## 2018-09-29 LAB — POCT URINE PREGNANCY: Preg Test, Ur: NEGATIVE

## 2018-09-29 MED ORDER — MEDROXYPROGESTERONE ACETATE 150 MG/ML IM SUSP
150.0000 mg | Freq: Once | INTRAMUSCULAR | Status: AC
  Administered 2018-09-29: 150 mg via INTRAMUSCULAR

## 2018-09-29 NOTE — Patient Instructions (Signed)
Good to see you today, you got your depo shot.  Use condoms for a week after this shot since you were late.  Happy holidays!

## 2019-02-08 ENCOUNTER — Encounter: Payer: Self-pay | Admitting: Family Medicine

## 2019-02-09 ENCOUNTER — Encounter: Payer: Self-pay | Admitting: Family Medicine

## 2019-02-09 ENCOUNTER — Ambulatory Visit (INDEPENDENT_AMBULATORY_CARE_PROVIDER_SITE_OTHER): Payer: BLUE CROSS/BLUE SHIELD | Admitting: Family Medicine

## 2019-02-09 ENCOUNTER — Other Ambulatory Visit: Payer: Self-pay

## 2019-02-09 VITALS — BP 110/62 | HR 95 | Temp 98.4°F | Resp 16 | Ht 63.0 in | Wt 113.0 lb

## 2019-02-09 DIAGNOSIS — N6320 Unspecified lump in the left breast, unspecified quadrant: Secondary | ICD-10-CM

## 2019-02-09 NOTE — Progress Notes (Signed)
[facility] [address], [address] [place], [place] [address] [phone] [phone]  Date:  02/09/2019   Name:  Lindsay HarborShalia Walker   DOB:  06/15/1996   MRN:  [id number]  PCP:  Pearline Cablesopland, Jessica C, MD    Chief Complaint: Breast Mass (lump in breast, noticied this morning, left breast, no family history breast cancer)   History of Present Illness:  Lindsay HarborShalia Walker is a 23 y.o. very pleasant female patient who presents with the following:  Young woman with history of hyperthyroidism who is here today with a concern of breast change She notes a lump in her left breast 2 nights ago It is tender to the touch Never noted this is the past   Otherwise she is feeling well No concern of illness She is not aware of any family history of breast cancer   She will be having her Depo-Provera injection, but wishes to discontinue this method.  She is thinking about getting an IUD.  For the time being she does not desire any contraception.  She has not had intercourse since her deco expired, so we do not expect pregnancy.  Patient Active Problem List   Diagnosis Date Noted  . Hyperthyroidism 09/16/2015    Past Medical History:  Diagnosis Date  . Asthma   . Hyperthyroidism     No past surgical history on file.  Social History   Tobacco Use  . Smoking status: Never Smoker  . Smokeless tobacco: Never Used  Substance Use Topics  . Alcohol use: Yes    Alcohol/week: 0.0 standard drinks  . Drug use: No    Family History  Problem Relation Age of Onset  . Asthma Mother   . Thyroid disease Neg Hx     Allergies  Allergen Reactions  . Penicillins     Hives Swelling     Medication list has been reviewed and updated.  Current Outpatient Medications on File Prior to Visit  Medication Sig Dispense Refill  . albuterol (PROVENTIL) (5 MG/ML) 0.5% nebulizer solution Take 2.5 mg by nebulization every 6 (six) hours as needed for wheezing or shortness of  breath.    . cetirizine (ZYRTEC) 10 MG tablet Take 1 tablet (10 mg total) by mouth daily. 90 tablet 3  . methimazole (TAPAZOLE) 10 MG tablet Take 2 tablets (20 mg total) by mouth 2 (two) times daily. (Patient taking differently: Take 20 mg by mouth 3 (three) times daily. ) 120 tablet 5  . VENTOLIN HFA 108 (90 Base) MCG/ACT inhaler INHALE 2 PUFFS INTO THE LUNGS EVERY 6 HOURS AS NEEDED FOR WHEEZING OR SHORTNESS OF BREATH 18 g 3  . MedroxyPROGESTERone Acetate 150 MG/ML SUSY      No current facility-administered medications on file prior to visit.     Review of Systems:  As per HPI- otherwise negative.   Physical Examination: Vitals:   02/09/19 1521  BP: 110/62  Pulse: 95  Resp: 16  Temp: 98.4 F (36.9 C)  SpO2: 100%   Vitals:   02/09/19 1521  Weight: 113 lb (51.3 kg)  Height: 5\' 3"  (1.6 m)   Body mass index is 20.02 kg/m. Ideal Body Weight: Weight in (lb) to have BMI = 25: 140.8  GEN: WDWN, NAD, Non-toxic, A & O x 3, slim build, looks well  HEENT: Atraumatic, Normocephalic. Neck supple. No masses, No LAD. Ears and Nose: No external deformity. CV: RRR, No M/G/R. No JVD. No thrill. No extra heart  sounds. PULM: CTA B, no wheezes, crackles, rhonchi. No retractions. No resp. distress. No accessory muscle use. ABD: S, NT, ND, +BS EXTR: No c/c/e NEURO Normal gait.  PSYCH: Normally interactive. Conversant. Not depressed or anxious appearing.  Calm demeanor.  Breast: The left breast displays a likely cyst in the upper outer quadrant, at approximately 2:00.  Cyst is substantial in size, approximately 2 x 3 cm.  It is soft and mobile.  Otherwise breast exam is normal, no masses/ dimpling/ discharge   Assessment and Plan: Left breast mass - Plan: US BREAST LTD UNI LEFT INC AXILLA, CANCELED: US BREAST COMPLETE UNI LEFT INC AXILLA  Here today with concern of breast mass.  She does indeed have an mass in the left breast, suspect a cyst.  I have called the breast center and arranged for  an ultrasound.  Ultrasound date was a month away which is too far out, however staff at the breast center plans to consult with the radiologist and get the patient moved up.  She will contact me if any concerns   Signed Abbe Amsterdam, MD

## 2019-02-09 NOTE — Patient Instructions (Addendum)
You are set up for a breast ultrasound at the [facility]   Address: [address], [place], [place] [address] Phone: [phone]  They gave me 5/26 at 12:30- however the breast center staff is going to get an exception so you won't have to wait so long  Thelma Barge is going to call you tomorrow to get you an earlier time

## 2019-02-11 ENCOUNTER — Other Ambulatory Visit: Payer: Self-pay

## 2019-02-11 ENCOUNTER — Ambulatory Visit
Admission: RE | Admit: 2019-02-11 | Discharge: 2019-02-11 | Disposition: A | Discharge status: Home / Self Care | Payer: BLUE CROSS/BLUE SHIELD | Source: Ambulatory Visit | Attending: Family Medicine | Admitting: Family Medicine

## 2019-02-11 DIAGNOSIS — N6320 Unspecified lump in the left breast, unspecified quadrant: Secondary | ICD-10-CM | POA: Diagnosis not present

## 2019-03-08 ENCOUNTER — Other Ambulatory Visit: Payer: BLUE CROSS/BLUE SHIELD

## 2019-04-16 ENCOUNTER — Encounter: Payer: Self-pay | Admitting: Family Medicine

## 2019-04-16 DIAGNOSIS — Z30014 Encounter for initial prescription of intrauterine contraceptive device: Secondary | ICD-10-CM

## 2019-05-03 ENCOUNTER — Encounter: Payer: Self-pay | Admitting: Family Medicine

## 2019-05-15 ENCOUNTER — Encounter: Payer: Self-pay | Admitting: Family Medicine

## 2019-05-15 DIAGNOSIS — N898 Other specified noninflammatory disorders of vagina: Secondary | ICD-10-CM

## 2019-05-15 DIAGNOSIS — B379 Candidiasis, unspecified: Secondary | ICD-10-CM

## 2019-05-15 MED ORDER — FLUCONAZOLE 150 MG PO TABS: 150.0000 mg | ORAL_TABLET | Freq: Once | ORAL | 0 refills | Status: DC

## 2019-05-23 ENCOUNTER — Ambulatory Visit: Payer: BLUE CROSS/BLUE SHIELD | Admitting: Family Medicine

## 2019-05-23 ENCOUNTER — Telehealth: Payer: Self-pay | Admitting: *Deleted

## 2019-05-23 NOTE — Telephone Encounter (Signed)
Please call patient to schedule appointment for tomorrow with copland (see mychart message)

## 2019-05-23 NOTE — Progress Notes (Deleted)
[facility] at [facility] [address], [place], [place] [address] [phone]  Date:  05/23/2019   Name:  Lindsay Walker   DOB:  Jun 14, 1996   MRN:  [id number]  PCP:  Darreld Mclean, MD    Chief Complaint: No chief complaint on file.   History of Present Illness:  Lindsay Walker is a 23 y.o. very pleasant female patient who presents with the following:  Visit today for vaginal itching- she had contacted me recently and requested a refill of her diflucan   Patient Active Problem List   Diagnosis Date Noted  . Hyperthyroidism 09/16/2015    Past Medical History:  Diagnosis Date  . Asthma   . Hyperthyroidism     No past surgical history on file.  Social History   Tobacco Use  . Smoking status: Never Smoker  . Smokeless tobacco: Never Used  Substance Use Topics  . Alcohol use: Yes    Alcohol/week: 0.0 standard drinks  . Drug use: No    Family History  Problem Relation Age of Onset  . Asthma Mother   . Thyroid disease Neg Hx     Allergies  Allergen Reactions  . Penicillins     Hives Swelling     Medication list has been reviewed and updated.  Current Outpatient Medications on File Prior to Visit  Medication Sig Dispense Refill  . albuterol (PROVENTIL) (5 MG/ML) 0.5% nebulizer solution Take 2.5 mg by nebulization every 6 (six) hours as needed for wheezing or shortness of breath.    . cetirizine (ZYRTEC) 10 MG tablet Take 1 tablet (10 mg total) by mouth daily. 90 tablet 3  . MedroxyPROGESTERone Acetate 150 MG/ML SUSY     . methimazole (TAPAZOLE) 10 MG tablet Take 2 tablets (20 mg total) by mouth 2 (two) times daily. (Patient taking differently: Take 20 mg by mouth 3 (three) times daily. ) 120 tablet 5  . VENTOLIN HFA 108 (90 Base) MCG/ACT inhaler INHALE 2 PUFFS INTO THE LUNGS EVERY 6 HOURS AS NEEDED FOR WHEEZING OR SHORTNESS OF BREATH 18 g 3   No current facility-administered medications on file prior to visit.      Review of Systems:  ***  Physical Examination: There were no vitals filed for this visit. There were no vitals filed for this visit. There is no height or weight on file to calculate BMI. Ideal Body Weight:    ***  Assessment and Plan: ***  Signed Lamar Blinks, MD

## 2019-05-24 NOTE — Progress Notes (Addendum)
[facility] [address], [address] [place], [place] [address] [phone]  Date:  05/25/2019   Name:  Lindsay HarborShalia Walker   DOB:  12/03/1995   MRN:  [id number]  PCP:  Pearline Cablesopland, Lindsay Mierzejewski C, MD    Chief Complaint: Vaginitis   History of Present Illness:  Lindsay Walker is a 23 y.o. very pleasant female patient who presents with the following:  Here today with concern of vaginal itching- ?yeast infection Negative pap 2 years ago.  Can repeat today if she likes   She had vaginal itching a couple of weeks ago She took a diflucan- 3 doses.  The last dose was earlier this week Itching seems to have finally resolved over the last couple of days No discharge- just itching  She is feeling pretty much back to normal at this time No urinary sx No new sexual partners or concern about STI  No abd pain or fevers She is generally in good health   She is using condoms for contraception.  She is getting an IUD placed in about one week.  LMP is now   Patient Active Problem List   Diagnosis Date Noted  . Hyperthyroidism 09/16/2015    Past Medical History:  Diagnosis Date  . Asthma   . Hyperthyroidism     No past surgical history on file.  Social History   Tobacco Use  . Smoking status: Never Smoker  . Smokeless tobacco: Never Used  Substance Use Topics  . Alcohol use: Yes    Alcohol/week: 0.0 standard drinks  . Drug use: No    Family History  Problem Relation Age of Onset  . Asthma Mother   . Thyroid disease Neg Hx     Allergies  Allergen Reactions  . Penicillins     Hives Swelling     Medication list has been reviewed and updated.  Current Outpatient Medications on File Prior to Visit  Medication Sig Dispense Refill  . albuterol (PROVENTIL) (5 MG/ML) 0.5% nebulizer solution Take 2.5 mg by nebulization every 6 (six) hours as needed for wheezing or shortness of breath.    . cetirizine (ZYRTEC) 10 MG tablet Take 1 tablet  (10 mg total) by mouth daily. 90 tablet 3  . MedroxyPROGESTERone Acetate 150 MG/ML SUSY     . methimazole (TAPAZOLE) 10 MG tablet Take 2 tablets (20 mg total) by mouth 2 (two) times daily. (Patient taking differently: Take 20 mg by mouth 3 (three) times daily. ) 120 tablet 5  . VENTOLIN HFA 108 (90 Base) MCG/ACT inhaler INHALE 2 PUFFS INTO THE LUNGS EVERY 6 HOURS AS NEEDED FOR WHEEZING OR SHORTNESS OF BREATH 18 g 3   No current facility-administered medications on file prior to visit.     Review of Systems:  As per HPI- otherwise negative.  No fever or chills No rashes  Physical Examination: Vitals:   05/25/19 1553  BP: 108/70  Pulse: 96  Resp: 16  Temp: 98.7 F (37.1 Walker)  SpO2: 100%   Vitals:   05/25/19 1553  Weight: 110 lb (49.9 kg)  Height: 5\' 3"  (1.6 m)   Body mass index is 19.49 kg/m. Ideal Body Weight: Weight in (lb) to have BMI = 25: 140.8  GEN: WDWN, NAD, Non-toxic, A & O x 3, looks well, slim build  HEENT: Atraumatic, Normocephalic. Neck supple. No masses, No LAD. Ears and Nose: No external deformity. CV: RRR, No M/G/R. No JVD. No thrill. No  extra heart sounds. PULM: CTA B, no wheezes, crackles, rhonchi. No retractions. No resp. distress. No accessory muscle use. ABD: S, NT, ND. No rebound. No HSM. EXTR: No Walker/Walker/e NEURO Normal gait.  PSYCH: Normally interactive. Conversant. Not depressed or anxious appearing.  Calm demeanor.  Pelvic: normal, no vaginal lesions or discharge. Uterus normal, no CMT, no adnexal tendereness or masses.  Light menstrual bleeding is present- swabbed cervix to dry prior to pap No discharge or other abnl noted   Pulse Readings from Last 3 Encounters:  05/25/19 96  02/09/19 95  09/29/18 81     Assessment and Plan:   ICD-10-CM   1. Screening for cervical cancer  Z12.4 Cytology - PAP  2. Vaginal itching  N89.8 Cervicovaginal ancillary only( [place])   Screening for cervical cancer, any other cause of vaginal itching today She  is getting an IUD in one week- encouraged her to abstain from sex until then to ensure she will be able to get her IUD placed - no chance of pregnancy   Follow-up: No follow-ups on file.  No orders of the defined types were placed in this encounter.  No orders of the defined types were placed in this encounter.   @SIGN @    Signed Lamar Blinks, MD  Received her Pap 8/18-  Results for orders placed or performed in visit on 05/25/19  Cytology - PAP  Result Value Ref Range   Adequacy (A)     Satisfactory for evaluation  endocervical/transformation zone component PRESENT.   Diagnosis (A)     LOW GRADE SQUAMOUS INTRAEPITHELIAL LESION: CIN-1/ HPV (LSIL).   Chlamydia Negative    Neisseria gonorrhea Negative    Material Submitted CervicoVaginal Pap [ThinPrep Imaged] (A)   Cervicovaginal ancillary only( [place])  Result Value Ref Range   Bacterial vaginitis Negative for Bacterial Vaginitis Microorganisms    Candida vaginitis Negative for Candida species    Trichomonas Negative    Given her age, the recommendation is to repeat Pap in 1 year She is to be see GYN this week-we will contact patient and forward To her gynecologist

## 2019-05-25 ENCOUNTER — Encounter: Payer: Self-pay | Admitting: Family Medicine

## 2019-05-25 ENCOUNTER — Ambulatory Visit (INDEPENDENT_AMBULATORY_CARE_PROVIDER_SITE_OTHER): Payer: BC Managed Care – PPO | Admitting: Family Medicine

## 2019-05-25 ENCOUNTER — Other Ambulatory Visit (HOSPITAL_COMMUNITY)
Admission: RE | Admit: 2019-05-25 | Discharge: 2019-05-25 | Disposition: A | Discharge status: Home / Self Care | Payer: BC Managed Care – PPO | Source: Ambulatory Visit | Attending: Family Medicine | Admitting: Family Medicine

## 2019-05-25 ENCOUNTER — Other Ambulatory Visit: Payer: Self-pay

## 2019-05-25 VITALS — BP 108/70 | HR 96 | Temp 98.7°F | Resp 16 | Ht 63.0 in | Wt 110.0 lb

## 2019-05-25 DIAGNOSIS — N898 Other specified noninflammatory disorders of vagina: Secondary | ICD-10-CM

## 2019-05-25 DIAGNOSIS — Z124 Encounter for screening for malignant neoplasm of cervix: Secondary | ICD-10-CM | POA: Diagnosis not present

## 2019-05-25 NOTE — Patient Instructions (Signed)
Great to see you today- take care and I will be in touch with your labs asap Let me know if any symptoms return or other concerns

## 2019-05-27 DIAGNOSIS — Z20828 Contact with and (suspected) exposure to other viral communicable diseases: Secondary | ICD-10-CM | POA: Diagnosis not present

## 2019-05-27 LAB — CERVICOVAGINAL ANCILLARY ONLY
Bacterial vaginitis: NEGATIVE
Candida vaginitis: NEGATIVE
Trichomonas: NEGATIVE

## 2019-05-31 ENCOUNTER — Encounter: Payer: Self-pay | Admitting: Family Medicine

## 2019-05-31 DIAGNOSIS — Z87410 Personal history of cervical dysplasia: Secondary | ICD-10-CM

## 2019-05-31 DIAGNOSIS — Z30431 Encounter for routine checking of intrauterine contraceptive device: Secondary | ICD-10-CM | POA: Diagnosis not present

## 2019-05-31 DIAGNOSIS — R87619 Unspecified abnormal cytological findings in specimens from cervix uteri: Secondary | ICD-10-CM | POA: Diagnosis not present

## 2019-05-31 DIAGNOSIS — N926 Irregular menstruation, unspecified: Secondary | ICD-10-CM | POA: Diagnosis not present

## 2019-05-31 HISTORY — DX: Personal history of cervical dysplasia: Z87.410

## 2019-05-31 LAB — CYTOLOGY - PAP
Chlamydia: NEGATIVE
Neisseria Gonorrhea: NEGATIVE

## 2019-06-03 DIAGNOSIS — E282 Polycystic ovarian syndrome: Secondary | ICD-10-CM

## 2019-06-03 HISTORY — DX: Polycystic ovarian syndrome: E28.2

## 2019-06-21 DIAGNOSIS — Z3202 Encounter for pregnancy test, result negative: Secondary | ICD-10-CM | POA: Diagnosis not present

## 2019-06-21 DIAGNOSIS — Z30430 Encounter for insertion of intrauterine contraceptive device: Secondary | ICD-10-CM | POA: Diagnosis not present

## 2019-07-30 DIAGNOSIS — J020 Streptococcal pharyngitis: Secondary | ICD-10-CM | POA: Diagnosis not present

## 2019-07-30 DIAGNOSIS — Z20828 Contact with and (suspected) exposure to other viral communicable diseases: Secondary | ICD-10-CM | POA: Diagnosis not present

## 2019-07-30 DIAGNOSIS — R6883 Chills (without fever): Secondary | ICD-10-CM | POA: Diagnosis not present

## 2019-07-30 DIAGNOSIS — R509 Fever, unspecified: Secondary | ICD-10-CM | POA: Diagnosis not present

## 2019-07-30 DIAGNOSIS — J029 Acute pharyngitis, unspecified: Secondary | ICD-10-CM | POA: Diagnosis not present

## 2019-08-02 ENCOUNTER — Telehealth: Payer: BC Managed Care – PPO | Admitting: Physician Assistant

## 2019-08-02 DIAGNOSIS — J029 Acute pharyngitis, unspecified: Secondary | ICD-10-CM | POA: Diagnosis not present

## 2019-08-02 MED ORDER — CLINDAMYCIN HCL 150 MG PO CAPS: 300.0000 mg | ORAL_CAPSULE | Freq: Three times a day (TID) | ORAL | 0 refills | Status: AC

## 2019-08-02 NOTE — Progress Notes (Signed)
We are sorry that you are not feeling well.  Given your symptoms it is possible that you have strep throat or another form of bacterial pharyngitis.  Thank you for including your lab results from the urgent care on Saturday.  I do see that your Covid, influenza and strep tests were all negative.    As your symptoms may be consistent with strep throat it is reasonable to try a course of antibiotics however other diseases such as mononucleosis can cause the same symptoms.  If you utilize the antibiotics and are not improved within 48 hours it is important to return to your primary care, urgent care or the emergency department for further testing for mononucleosis or other types of bacterial infections.  Additionally if you begin to have trouble swallowing, or running high fevers or have throat swelling you should present immediately for further evaluation.  Strep pharyngitis is inflammation and infection in the back of the throat.  This is an infection cause by bacteria and is treated with antibiotics.  I have prescribed Clindamycin 300 mg three times a day for 10 days. For throat pain, we recommend over the counter oral pain relief medications such as acetaminophen or aspirin, or anti-inflammatory medications such as ibuprofen or naproxen sodium. Topical treatments such as oral throat lozenges or sprays may be used as needed. Strep infections are not as easily transmitted as other respiratory infections, however we still recommend that you avoid close contact with loved ones, especially the very young and elderly.  Remember to wash your hands thoroughly throughout the day as this is the number one way to prevent the spread of infection and wipe down door knobs and counters with disinfectant.   Home Care:  Only take medications as instructed by your medical team.  Complete the entire course of an antibiotic.  Do not take these medications with alcohol.  A steam or ultrasonic humidifier can help  congestion.  You can place a towel over your head and breathe in the steam from hot water coming from a faucet.  Avoid close contacts especially the very young and the elderly.  Cover your mouth when you cough or sneeze.  Always remember to wash your hands.  Get Help Right Away If:  You develop worsening fever or sinus pain.  You develop a severe head ache or visual changes.  Your symptoms persist after you have completed your treatment plan.  Make sure you  Understand these instructions.  Will watch your condition.  Will get help right away if you are not doing well or get worse.  Your e-visit answers were reviewed by a board certified advanced clinical practitioner to complete your personal care plan.  Depending on the condition, your plan could have included both over the counter or prescription medications.  If there is a problem please reply  once you have received a response from your provider.  Your safety is important to [place].  If you have drug allergies check your prescription carefully.    You can use MyChart to ask questions about today's visit, request a non-urgent call back, or ask for a work or school excuse for 24 hours related to this e-Visit. If it has been greater than 24 hours you will need to follow up with your provider, or enter a new e-Visit to address those concerns.  You will get an e-mail in the next two days asking about your experience.  I hope that your e-visit has been valuable and will speed your  recovery. Thank you for using e-visits.  Greater than 5 minutes, yet less than 10 minutes of time have been spent researching, coordinating, and implementing care for this patient today

## 2019-08-16 ENCOUNTER — Other Ambulatory Visit: Payer: Self-pay

## 2019-08-17 ENCOUNTER — Encounter: Payer: Self-pay | Admitting: Endocrinology

## 2019-08-17 ENCOUNTER — Ambulatory Visit (INDEPENDENT_AMBULATORY_CARE_PROVIDER_SITE_OTHER): Payer: BC Managed Care – PPO | Admitting: Endocrinology

## 2019-08-17 VITALS — BP 100/64 | HR 129 | Ht 63.0 in | Wt 110.8 lb

## 2019-08-17 DIAGNOSIS — E0590 Thyrotoxicosis, unspecified without thyrotoxic crisis or storm: Secondary | ICD-10-CM | POA: Diagnosis not present

## 2019-08-17 DIAGNOSIS — N83209 Unspecified ovarian cyst, unspecified side: Secondary | ICD-10-CM

## 2019-08-17 DIAGNOSIS — Z30431 Encounter for routine checking of intrauterine contraceptive device: Secondary | ICD-10-CM | POA: Diagnosis not present

## 2019-08-17 HISTORY — DX: Unspecified ovarian cyst, unspecified side: N83.209

## 2019-08-17 LAB — TSH: TSH: 0.57 u[IU]/mL (ref 0.35–4.50)

## 2019-08-17 LAB — T4, FREE: Free T4: 0.94 ng/dL (ref 0.60–1.60)

## 2019-08-17 NOTE — Progress Notes (Signed)
Subjective:    Patient ID: Lindsay Walker, female    DOB: 1996-04-18, 23 y.o.   MRN: [id number]  HPI Pt returns for f/u of hyperthyroidism (dx'ed late 2016; she has never had thyroid imaging, but Grave's Dz is presumed, due to severity; tapazole is chosen as initial rx, also due to severity; she takes OC's as rx'ed; she would like to take RAI, but cannot afford it now).  Pt says she has not recently taken the tapazole.  pt states slight tremor of the hands, and assoc palpitations.   Past Medical History:  Diagnosis Date  . Asthma   . Hyperthyroidism     No past surgical history on file.  Social History   Socioeconomic History  . Marital status: Single    Spouse name: Not on file  . Number of children: Not on file  . Years of education: Not on file  . Highest education level: Not on file  Occupational History  . Not on file  Social Needs  . Financial resource strain: Not on file  . Food insecurity    Worry: Not on file    Inability: Not on file  . Transportation needs    Medical: Not on file    Non-medical: Not on file  Tobacco Use  . Smoking status: Never Smoker  . Smokeless tobacco: Never Used  Substance and Sexual Activity  . Alcohol use: Yes    Alcohol/week: 0.0 standard drinks  . Drug use: No  . Sexual activity: Not on file  Lifestyle  . Physical activity    Days per week: Not on file    Minutes per session: Not on file  . Stress: Not on file  Relationships  . Social Musician on phone: Not on file    Gets together: Not on file    Attends religious service: Not on file    Active member of club or organization: Not on file    Attends meetings of clubs or organizations: Not on file    Relationship status: Not on file  . Intimate partner violence    Fear of current or ex partner: Not on file    Emotionally abused: Not on file    Physically abused: Not on file    Forced sexual activity: Not on file  Other Topics Concern  . Not on file  Social  History Narrative  . Not on file    Current Outpatient Medications on File Prior to Visit  Medication Sig Dispense Refill  . albuterol (PROVENTIL) (5 MG/ML) 0.5% nebulizer solution Take 2.5 mg by nebulization every 6 (six) hours as needed for wheezing or shortness of breath.    . cetirizine (ZYRTEC) 10 MG tablet Take 1 tablet (10 mg total) by mouth daily. 90 tablet 3  . MedroxyPROGESTERone Acetate 150 MG/ML SUSY     . VENTOLIN HFA 108 (90 Base) MCG/ACT inhaler INHALE 2 PUFFS INTO THE LUNGS EVERY 6 HOURS AS NEEDED FOR WHEEZING OR SHORTNESS OF BREATH 18 g 3   No current facility-administered medications on file prior to visit.     Allergies  Allergen Reactions  . Penicillins     Hives Swelling     Family History  Problem Relation Age of Onset  . Asthma Mother   . Thyroid disease Neg Hx     BP 100/64 (BP Location: Left Arm, Patient Position: Sitting, Cuff Size: Normal)   Pulse (!) 129   Ht 5\' 3"  (1.6 m)   Wt  110 lb 12.8 oz (50.3 kg)   SpO2 99%   BMI 19.63 kg/m    Review of Systems Denies fever and neck swelling.      Objective:   Physical Exam  VITAL SIGNS:  See vs page GENERAL: no distress. NECK: thyroid is 3-5 times normal size, with irreg surface.  Skin: not diaphoretic.   Neuro: no tremor is noted now.       Assessment & Plan:  Hyperthyroidism: recheck today.  Goiter: clinically stable.   Patient Instructions  blood tests are requested for you today.  We'll let you know about the results. The results will probably say we need to resume the methimazole If ever you have fever while taking methimazole, stop it and call us, even if the reason is obvious, because of the risk of a rare side-effect.  Please come back for a follow-up appointment in 1 month.

## 2019-08-17 NOTE — Patient Instructions (Addendum)
blood tests are requested for you today.  We'll let you know about the results. The results will probably say we need to resume the methimazole If ever you have fever while taking methimazole, stop it and call us, even if the reason is obvious, because of the risk of a rare side-effect.  Please come back for a follow-up appointment in 1 month.

## 2019-09-20 ENCOUNTER — Telehealth: Payer: Self-pay | Admitting: Endocrinology

## 2019-09-20 NOTE — Telephone Encounter (Signed)
please contact patient: No need for labs Please come back for a follow-up appointment in 3 months

## 2019-09-20 NOTE — Telephone Encounter (Signed)
Per Dr. Cordelia Pen request, please call pt to cancel lab appt and schedule 3 mo f/u

## 2019-09-22 ENCOUNTER — Encounter: Payer: Self-pay | Admitting: Endocrinology

## 2019-09-22 ENCOUNTER — Other Ambulatory Visit: Payer: Self-pay

## 2019-09-22 ENCOUNTER — Encounter: Payer: BC Managed Care – PPO | Admitting: Endocrinology

## 2019-09-22 NOTE — Telephone Encounter (Signed)
Pt was seen in office today

## 2019-10-31 NOTE — Progress Notes (Addendum)
Lindsay Walker at [facility] [address] [place], [place] [address] [phone]  Date:  11/02/2019   Name:  Lindsay Walker   DOB:  [birth year]   MRN:  [id number]  PCP:  Pearline Cables, MD    Chief Complaint: Bruising (bruising on toes, tender to touch, started in march, )   History of Present Illness:  Lindsay Walker is a 24 y.o. very pleasant female patient who presents with the following:  Generally healthy young woman with history of hyperthyroidism/Graves' disease, asthma Her endocrinologist is Dr. Mikael Spray saw him in November; TSH normal Pap done in August Here today with concern of "bruised toes"  She developed some unusual skin lesions on the tips of her toes back in March- she was dancing on pointe but stopped doing this, it did not get better however, in fact may have gotten worse She has put dancing on hold completely for the last several months It is tender esp if water touches it-however not generally painful  She is otherwise feeling well She has danced her whole life, this was not a new activity  No other unusual skin findings, no nosebleeds or other unusual bleeding.  No fever or chills.  She otherwise feels fine  Patient Active Problem List   Diagnosis Date Noted  . Hyperthyroidism 09/16/2015    Past Medical History:  Diagnosis Date  . Asthma   . Hyperthyroidism     History reviewed. No pertinent surgical history.  Social History   Tobacco Use  . Smoking status: Never Smoker  . Smokeless tobacco: Never Used  Substance Use Topics  . Alcohol use: Yes    Alcohol/week: 0.0 standard drinks  . Drug use: No    Family History  Problem Relation Age of Onset  . Asthma Mother   . Thyroid disease Neg Hx     Allergies  Allergen Reactions  . Penicillins     Hives Swelling     Medication list has been reviewed and updated.  Current Outpatient Medications on File Prior to Visit  Medication Sig  Dispense Refill  . cetirizine (ZYRTEC) 10 MG tablet Take 1 tablet (10 mg total) by mouth daily. 90 tablet 3  . VENTOLIN HFA 108 (90 Base) MCG/ACT inhaler INHALE 2 PUFFS INTO THE LUNGS EVERY 6 HOURS AS NEEDED FOR WHEEZING OR SHORTNESS OF BREATH 18 g 3   No current facility-administered medications on file prior to visit.    Review of Systems:  As per HPI- otherwise negative. Pulse Readings from Last 3 Encounters:  11/02/19 (!) 108  08/17/19 (!) 129  05/25/19 96     Physical Examination: Vitals:   11/02/19 1600  BP: 110/70  Pulse: (!) 108  Resp: 16  Temp: 97.6 F (36.4 C)  SpO2: 100%   Vitals:   11/02/19 1600  Weight: 110 lb (49.9 kg)  Height: 5\' 3"  (1.6 m)   Body mass index is 19.49 kg/m. Ideal Body Weight: Weight in (lb) to have BMI = 25: 140.8  GEN: WDWN, NAD, Non-toxic, A & O x 3, petite build, looks well HEENT: Atraumatic, Normocephalic. Neck supple. No masses, No LAD. Ears and Nose: No external deformity. CV: RRR, No M/G/R. No JVD. No thrill. No extra heart sounds. PULM: CTA B, no wheezes, crackles, rhonchi. No retractions. No resp. distress. No accessory muscle use. EXTR: No c/c/e NEURO Normal gait.  PSYCH: Normally interactive. Conversant. Not depressed or anxious appearing.  Calm demeanor.  Patient  has somewhat unusual, scaly and hyperpigmented lesions at the lateral aspect of her right great, second and third toes.  There is a smaller lesion on the left second toe.  They are not especially tender to palpation.  Appear possibly due to vasculitis      Assessment and Plan: Skin rash - Plan: CBC, Comprehensive metabolic panel, Sedimentation rate, ANA, C-reactive protein, Ambulatory referral to Dermatology, triamcinolone ointment (KENALOG) 0.5 %  Here today with unusual skin findings on her toes.  The patient does dance on pointe, however she has not done this in several months since the symptoms began and they have not resolved-in fact gotten worse We will  obtain lab work as above, trial of triamcinolone  I advised her that she may go back to dance class if she likes, but avoid toe shoes for the time being  Mild tachycardia is typical for her with her thyroid disease  Referral placed to dermatology in case needed Moderate medical decision making This visit occurred during the SARS-CoV-2 public health emergency.  Safety protocols were in place, including screening questions prior to the visit, additional usage of staff PPE, and extensive cleaning of exam room while observing appropriate contact time as indicated for disinfecting solutions.    Signed Lamar Blinks, MD Received her labs 1/21  Results for orders placed or performed in visit on 11/02/19  CBC  Result Value Ref Range   WBC 4.2 4.0 - 10.5 K/uL   RBC 4.83 3.87 - 5.11 Mil/uL   Platelets 180.0 150.0 - 400.0 K/uL   Hemoglobin 12.8 12.0 - 15.0 g/dL   HCT 40.5 36.0 - 46.0 %   MCV 83.8 78.0 - 100.0 fl   MCHC 31.7 30.0 - 36.0 g/dL   RDW 14.7 11.5 - 15.5 %  Comprehensive metabolic panel  Result Value Ref Range   Sodium 138 135 - 145 mEq/L   Potassium 5.1 3.5 - 5.1 mEq/L   Chloride 104 96 - 112 mEq/L   CO2 30 19 - 32 mEq/L   Glucose, Bld 83 70 - 99 mg/dL   BUN 18 6 - 23 mg/dL   Creatinine, Ser 0.78 0.40 - 1.20 mg/dL   Total Bilirubin 0.4 0.2 - 1.2 mg/dL   Alkaline Phosphatase 41 39 - 117 U/L   AST 15 0 - 37 U/L   ALT 13 0 - 35 U/L   Total Protein 8.3 6.0 - 8.3 g/dL   Albumin 4.8 3.5 - 5.2 g/dL   GFR 109.76 >60.00 mL/min   Calcium 10.5 8.4 - 10.5 mg/dL  Sedimentation rate  Result Value Ref Range   Sed Rate 9 0 - 20 mm/hr  ANA  Result Value Ref Range   Anti Nuclear Antibody (ANA) POSITIVE (A) NEGATIVE  C-reactive protein  Result Value Ref Range   CRP <1.0 0.5 - 20.0 mg/dL  Anti-nuclear ab-titer (ANA titer)  Result Value Ref Range   ANA Titer 1 1:320 (H) titer   ANA Pattern 1 Nuclear, Homogeneous (A)    Message to pt   addnd 1/22- received ANA, quite positive.   Will refer to rheumatology

## 2019-11-02 ENCOUNTER — Other Ambulatory Visit: Payer: Self-pay

## 2019-11-02 ENCOUNTER — Encounter: Payer: Self-pay | Admitting: Family Medicine

## 2019-11-02 ENCOUNTER — Ambulatory Visit (INDEPENDENT_AMBULATORY_CARE_PROVIDER_SITE_OTHER): Payer: BC Managed Care – PPO | Admitting: Family Medicine

## 2019-11-02 VITALS — BP 110/70 | HR 108 | Temp 97.6°F | Resp 16 | Ht 63.0 in | Wt 110.0 lb

## 2019-11-02 DIAGNOSIS — R21 Rash and other nonspecific skin eruption: Secondary | ICD-10-CM | POA: Diagnosis not present

## 2019-11-02 DIAGNOSIS — R768 Other specified abnormal immunological findings in serum: Secondary | ICD-10-CM | POA: Diagnosis not present

## 2019-11-02 MED ORDER — TRIAMCINOLONE ACETONIDE 0.5 % EX OINT: 1.0000 "application " | TOPICAL_OINTMENT | Freq: Two times a day (BID) | CUTANEOUS | 0 refills | Status: DC

## 2019-11-02 NOTE — Patient Instructions (Signed)
It was good to see you today, but I am sorry you have this bothersome rash on your toes!   We will get some labs today in hopes of finding a clue In the meantime I am going to refer you to dermatology, and let's have you try the triamcinolone ointment once or twice a day for about 10 days- it not helping you can stop use  Please let me know if anything is changing or getting worse I think you can go back to dance, just avoid pointe until this is sorted out

## 2019-11-03 ENCOUNTER — Encounter: Payer: Self-pay | Admitting: Family Medicine

## 2019-11-03 LAB — COMPREHENSIVE METABOLIC PANEL
ALT: 13 U/L (ref 0–35)
AST: 15 U/L (ref 0–37)
Albumin: 4.8 g/dL (ref 3.5–5.2)
Alkaline Phosphatase: 41 U/L (ref 39–117)
BUN: 18 mg/dL (ref 6–23)
CO2: 30 mEq/L (ref 19–32)
Calcium: 10.5 mg/dL (ref 8.4–10.5)
Chloride: 104 mEq/L (ref 96–112)
Creatinine, Ser: 0.78 mg/dL (ref 0.40–1.20)
GFR: 109.76 mL/min (ref >60.00)
Glucose, Bld: 83 mg/dL (ref 70–99)
Potassium: 5.1 mEq/L (ref 3.5–5.1)
Sodium: 138 mEq/L (ref 135–145)
Total Bilirubin: 0.4 mg/dL (ref 0.2–1.2)
Total Protein: 8.3 g/dL (ref 6.0–8.3)

## 2019-11-03 LAB — CBC
HCT: 40.5 % (ref 36.0–46.0)
Hemoglobin: 12.8 g/dL (ref 12.0–15.0)
MCHC: 31.7 g/dL (ref 30.0–36.0)
MCV: 83.8 fl (ref 78.0–100.0)
Platelets: 180 10*3/uL (ref 150.0–400.0)
RBC: 4.83 Mil/uL (ref 3.87–5.11)
RDW: 14.7 % (ref 11.5–15.5)
WBC: 4.2 10*3/uL (ref 4.0–10.5)

## 2019-11-03 LAB — C-REACTIVE PROTEIN: CRP: <1 mg/dL (ref 0.5–20.0)

## 2019-11-03 LAB — SEDIMENTATION RATE: Sed Rate: 9 mm/hr (ref 0–20)

## 2019-11-04 ENCOUNTER — Encounter: Payer: Self-pay | Admitting: Family Medicine

## 2019-11-04 LAB — ANTI-NUCLEAR AB-TITER (ANA TITER): ANA Titer 1: 1:320 {titer} — ABNORMAL HIGH

## 2019-11-04 LAB — ANA: Anti Nuclear Antibody (ANA): POSITIVE — AB

## 2019-11-04 NOTE — Addendum Note (Signed)
Addended by: Pearline Cables on: 11/04/2019 06:24 AM   Modules accepted: Orders

## 2019-12-31 ENCOUNTER — Encounter: Payer: Self-pay | Admitting: Family Medicine

## 2019-12-31 DIAGNOSIS — B379 Candidiasis, unspecified: Secondary | ICD-10-CM

## 2019-12-31 DIAGNOSIS — N898 Other specified noninflammatory disorders of vagina: Secondary | ICD-10-CM

## 2020-01-01 MED ORDER — FLUCONAZOLE 150 MG PO TABS: 150.0000 mg | ORAL_TABLET | Freq: Once | ORAL | 0 refills | Status: AC

## 2020-01-03 ENCOUNTER — Ambulatory Visit: Payer: BC Managed Care – PPO | Admitting: Rheumatology

## 2020-01-18 ENCOUNTER — Ambulatory Visit: Payer: BC Managed Care – PPO | Admitting: Family Medicine

## 2020-02-01 ENCOUNTER — Ambulatory Visit: Payer: BC Managed Care – PPO | Admitting: Rheumatology

## 2020-02-21 DIAGNOSIS — H16263 Vernal keratoconjunctivitis, with limbar and corneal involvement, bilateral: Secondary | ICD-10-CM | POA: Diagnosis not present

## 2020-02-21 DIAGNOSIS — H16292 Other keratoconjunctivitis, left eye: Secondary | ICD-10-CM | POA: Diagnosis not present

## 2020-03-06 ENCOUNTER — Ambulatory Visit: Payer: BC Managed Care – PPO | Admitting: Rheumatology

## 2020-03-26 NOTE — Progress Notes (Signed)
[facility] at [facility] [address] [place], [place] [address] [phone]  Date:  03/28/2020   Name:  Lindsay Walker   DOB:  June 07, 1996   MRN:  [id number]  PCP:  Pearline Cables, MD    Chief Complaint: Annual Exam (sees gyn)   History of Present Illness:  Lindsay Walker is a 25 y.o. very pleasant female patient who presents with the following:  Patient here today for routine physical exam.  History of hyperthyroidism/Graves' disease, asthma Last seen by myself in January with a rash-she has some unusual findings on her toes.  I referred her to dermatology and obtained some blood work  Her ANA at that time was strongly positive and she was referred to rheumatology--however she did not end up being seen She notes that the findings on her toes are much better, though not completely resolved.  She has not been dancing, which seem to exacerbate her symptoms.  However, she does miss dance.  We plan to repeat ANA today  COVID-19 series; encouraged her to have this done  Pap last August showed LSIL.  She was referred to GYN.  Dr. Belva Agee She was dx with PCOS and has a Mirena IUD.  The patient not sure if they really discussed her Pap, she did not have a follow-up colposcopy.  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  Right now she is working   Patient Active Problem List   Diagnosis Date Noted  . Hyperthyroidism 09/16/2015    Past Medical History:  Diagnosis Date  . Asthma   . Hyperthyroidism     No past surgical history on file.  Social History   Tobacco Use  . Smoking status: Never Smoker  . Smokeless tobacco: Never Used  Substance Use Topics  . Alcohol use: Yes    Alcohol/week: 0.0  standard drinks  . Drug use: No    Family History  Problem Relation Age of Onset  . Asthma Mother   . Thyroid disease Neg Hx     Allergies  Allergen Reactions  . Penicillins     Hives Swelling     Medication list has been reviewed and updated.  Current Outpatient Medications on File Prior to Visit  Medication Sig Dispense Refill  . cetirizine (ZYRTEC) 10 MG tablet Take 1 tablet (10 mg total) by mouth daily. 90 tablet 3  . VENTOLIN HFA 108 (90 Base) MCG/ACT inhaler INHALE 2 PUFFS INTO THE LUNGS EVERY 6 HOURS AS NEEDED FOR WHEEZING OR SHORTNESS OF BREATH 18 g 3   No current facility-administered medications on file prior to visit.    Review of Systems:  As per HPI- otherwise negative.   Physical Examination: Vitals:   03/28/20 1357  BP: 108/68  Pulse: 99  Resp: 16  Temp: 97.9 F (36.6 C)  SpO2: 99%   Vitals:   03/28/20 1357  Weight: 117 lb (53.1 kg)  Height: 5\' 3"  (1.6 m)   Body mass index is 20.73 kg/m. Ideal Body Weight: Weight in (lb) to have BMI = 25: 140.8  GEN: no acute distress.  Slim build, looks well HEENT: Atraumatic, Normocephalic.  Ears and Nose: No external deformity. CV: RRR, No M/G/R. No  JVD. No thrill. No extra heart sounds. PULM: CTA B, no wheezes, crackles, rhonchi. No retractions. No resp. distress. No accessory muscle use. ABD: S, NT, ND, +BS. No rebound. No HSM. EXTR: No c/c/e PSYCH: Normally interactive. Conversant.  Pap collected, vagina and vulva normal   Assessment and Plan: Physical exam  ANA positive - Plan: ANA  History of abnormal cervical Pap smear - Plan: Cytology - PAP   Here today for routine physical.  Patient declines any other lab work today, but we will recheck her ANA.  If this remains quite high I would encourage her to follow-up with rheumatology Pap is pending Encouraged her to have COVID-19 vaccine Will plan further follow- up pending labs. Encouraged continued healthy diet and exercise.  Patient is a  non-smoker and does not abuse alcohol. I did advise her that likely okay for her to try returning to dance.  She really misses this activity.  She may want to minimize dancing on point however due to her toe problem This visit occurred during the SARS-CoV-2 public health emergency.  Safety protocols were in place, including screening questions prior to the visit, additional usage of staff PPE, and extensive cleaning of exam room while observing appropriate contact time as indicated for disinfecting solutions.    Signed Lamar Blinks, MD

## 2020-03-26 NOTE — Patient Instructions (Addendum)
It was great to see you again today We will check your pap and ANA for you and I will be in touch asap    Health Maintenance, Female Adopting a healthy lifestyle and getting preventive care are important in promoting health and wellness. Ask your health care provider about:  The right schedule for you to have regular tests and exams.  Things you can do on your own to prevent diseases and keep yourself healthy. What should I know about diet, weight, and exercise? Eat a healthy diet   Eat a diet that includes plenty of vegetables, fruits, low-fat dairy products, and lean protein.  Do not eat a lot of foods that are high in solid fats, added sugars, or sodium. Maintain a healthy weight Body mass index (BMI) is used to identify weight problems. It estimates body fat based on height and weight. Your health care provider can help determine your BMI and help you achieve or maintain a healthy weight. Get regular exercise Get regular exercise. This is one of the most important things you can do for your health. Most adults should:  Exercise for at least 150 minutes each week. The exercise should increase your heart rate and make you sweat (moderate-intensity exercise).  Do strengthening exercises at least twice a week. This is in addition to the moderate-intensity exercise.  Spend less time sitting. Even light physical activity can be beneficial. Watch cholesterol and blood lipids Have your blood tested for lipids and cholesterol at 24 years of age, then have this test every 5 years. Have your cholesterol levels checked more often if:  Your lipid or cholesterol levels are high.  You are older than 24 years of age.  You are at high risk for heart disease. What should I know about cancer screening? Depending on your health history and family history, you may need to have cancer screening at various ages. This may include screening for:  Breast cancer.  Cervical cancer.  Colorectal  cancer.  Skin cancer.  Lung cancer. What should I know about heart disease, diabetes, and high blood pressure? Blood pressure and heart disease  High blood pressure causes heart disease and increases the risk of stroke. This is more likely to develop in people who have high blood pressure readings, are of African descent, or are overweight.  Have your blood pressure checked: ? Every 3-5 years if you are 64-51 years of age. ? Every year if you are 14 years old or older. Diabetes Have regular diabetes screenings. This checks your fasting blood sugar level. Have the screening done:  Once every three years after age 77 if you are at a normal weight and have a low risk for diabetes.  More often and at a younger age if you are overweight or have a high risk for diabetes. What should I know about preventing infection? Hepatitis B If you have a higher risk for hepatitis B, you should be screened for this virus. Talk with your health care provider to find out if you are at risk for hepatitis B infection. Hepatitis C Testing is recommended for:  Everyone born from [birth year] through 1965.  Anyone with known risk factors for hepatitis C. Sexually transmitted infections (STIs)  Get screened for STIs, including gonorrhea and chlamydia, if: ? You are sexually active and are younger than 24 years of age. ? You are older than 24 years of age and your health care provider tells you that you are at risk for this type of infection. ?  Your sexual activity has changed since you were last screened, and you are at increased risk for chlamydia or gonorrhea. Ask your health care provider if you are at risk.  Ask your health care provider about whether you are at high risk for HIV. Your health care provider may recommend a prescription medicine to help prevent HIV infection. If you choose to take medicine to prevent HIV, you should first get tested for HIV. You should then be tested every 3 months for as long as  you are taking the medicine. Pregnancy  If you are about to stop having your period (premenopausal) and you may become pregnant, seek counseling before you get pregnant.  Take 400 to 800 micrograms (mcg) of folic acid every day if you become pregnant.  Ask for birth control (contraception) if you want to prevent pregnancy. Osteoporosis and menopause Osteoporosis is a disease in which the bones lose minerals and strength with aging. This can result in bone fractures. If you are 6 years old or older, or if you are at risk for osteoporosis and fractures, ask your health care provider if you should:  Be screened for bone loss.  Take a calcium or vitamin D supplement to lower your risk of fractures.  Be given hormone replacement therapy (HRT) to treat symptoms of menopause. Follow these instructions at home: Lifestyle  Do not use any products that contain nicotine or tobacco, such as cigarettes, e-cigarettes, and chewing tobacco. If you need help quitting, ask your health care provider.  Do not use street drugs.  Do not share needles.  Ask your health care provider for help if you need support or information about quitting drugs. Alcohol use  Do not drink alcohol if: ? Your health care provider tells you not to drink. ? You are pregnant, may be pregnant, or are planning to become pregnant.  If you drink alcohol: ? Limit how much you use to 0-1 drink a day. ? Limit intake if you are breastfeeding.  Be aware of how much alcohol is in your drink. In the [place] one drink equals one 12 oz bottle of beer (355 mL), one 5 oz glass of wine (148 mL), or one 1 oz glass of hard liquor (44 mL). General instructions  Schedule regular health, dental, and eye exams.  Stay current with your vaccines.  Tell your health care provider if: ? You often feel depressed. ? You have ever been abused or do not feel safe at home. Summary  Adopting a healthy lifestyle and getting preventive care are  important in promoting health and wellness.  Follow your health care provider's instructions about healthy diet, exercising, and getting tested or screened for diseases.  Follow your health care provider's instructions on monitoring your cholesterol and blood pressure. This information is not intended to replace advice given to you by your health care provider. Make sure you discuss any questions you have with your health care provider. Document Revised: 09/22/2018 Document Reviewed: 09/22/2018 Elsevier Patient Education  2020 [employer].

## 2020-03-28 ENCOUNTER — Other Ambulatory Visit: Payer: Self-pay

## 2020-03-28 ENCOUNTER — Other Ambulatory Visit (HOSPITAL_COMMUNITY)
Admission: RE | Admit: 2020-03-28 | Discharge: 2020-03-28 | Disposition: A | Discharge status: Home / Self Care | Payer: BC Managed Care – PPO | Source: Ambulatory Visit | Attending: Family Medicine | Admitting: Family Medicine

## 2020-03-28 ENCOUNTER — Encounter: Payer: Self-pay | Admitting: Family Medicine

## 2020-03-28 ENCOUNTER — Ambulatory Visit (INDEPENDENT_AMBULATORY_CARE_PROVIDER_SITE_OTHER): Payer: BC Managed Care – PPO | Admitting: Family Medicine

## 2020-03-28 ENCOUNTER — Ambulatory Visit: Payer: BC Managed Care – PPO | Admitting: Rheumatology

## 2020-03-28 VITALS — BP 108/68 | HR 99 | Temp 97.9°F | Resp 16 | Ht 63.0 in | Wt 117.0 lb

## 2020-03-28 DIAGNOSIS — Z8742 Personal history of other diseases of the female genital tract: Secondary | ICD-10-CM

## 2020-03-28 DIAGNOSIS — R768 Other specified abnormal immunological findings in serum: Secondary | ICD-10-CM

## 2020-03-28 DIAGNOSIS — Z2009 Contact with and (suspected) exposure to other intestinal infectious diseases: Secondary | ICD-10-CM

## 2020-03-28 DIAGNOSIS — Z0000 Encounter for general adult medical examination without abnormal findings: Secondary | ICD-10-CM

## 2020-03-29 DIAGNOSIS — R87612 Low grade squamous intraepithelial lesion on cytologic smear of cervix (LGSIL): Secondary | ICD-10-CM | POA: Diagnosis not present

## 2020-03-30 LAB — ANTI-NUCLEAR AB-TITER (ANA TITER)
ANA TITER: 1:160 {titer} — ABNORMAL HIGH
ANA Titer 1: 1:40 {titer} — ABNORMAL HIGH

## 2020-03-30 LAB — ANA: Anti Nuclear Antibody (ANA): POSITIVE — AB

## 2020-03-31 ENCOUNTER — Encounter: Payer: Self-pay | Admitting: Family Medicine

## 2020-04-02 ENCOUNTER — Encounter: Payer: Self-pay | Admitting: Family Medicine

## 2020-04-02 ENCOUNTER — Other Ambulatory Visit: Payer: Self-pay | Admitting: Family Medicine

## 2020-04-02 DIAGNOSIS — R768 Other specified abnormal immunological findings in serum: Secondary | ICD-10-CM

## 2020-04-02 LAB — CYTOLOGY - PAP
Comment: NEGATIVE
High risk HPV: POSITIVE — AB

## 2020-04-03 ENCOUNTER — Encounter: Payer: Self-pay | Admitting: Family Medicine

## 2020-04-03 DIAGNOSIS — R87612 Low grade squamous intraepithelial lesion on cytologic smear of cervix (LGSIL): Secondary | ICD-10-CM

## 2020-04-06 ENCOUNTER — Other Ambulatory Visit: Payer: Self-pay | Admitting: Family Medicine

## 2020-04-06 DIAGNOSIS — R87612 Low grade squamous intraepithelial lesion on cytologic smear of cervix (LGSIL): Secondary | ICD-10-CM

## 2020-04-11 ENCOUNTER — Other Ambulatory Visit (INDEPENDENT_AMBULATORY_CARE_PROVIDER_SITE_OTHER): Payer: BC Managed Care – PPO

## 2020-04-11 ENCOUNTER — Other Ambulatory Visit: Payer: Self-pay

## 2020-04-11 DIAGNOSIS — Z2009 Contact with and (suspected) exposure to other intestinal infectious diseases: Secondary | ICD-10-CM | POA: Diagnosis not present

## 2020-04-11 DIAGNOSIS — Z789 Other specified health status: Secondary | ICD-10-CM | POA: Diagnosis not present

## 2020-04-11 DIAGNOSIS — Z0184 Encounter for antibody response examination: Secondary | ICD-10-CM | POA: Diagnosis not present

## 2020-04-12 ENCOUNTER — Encounter: Payer: Self-pay | Admitting: Family Medicine

## 2020-04-13 LAB — QUANTIFERON-TB GOLD PLUS
Mitogen-NIL: >10 IU/mL
NIL: 0.05 IU/mL
QuantiFERON-TB Gold Plus: NEGATIVE
TB1-NIL: 0.01 IU/mL
TB2-NIL: 0.01 IU/mL

## 2020-04-13 LAB — VARICELLA ZOSTER ANTIBODY, IGG: Varicella IgG: <135 index — ABNORMAL LOW

## 2020-04-19 DIAGNOSIS — Z23 Encounter for immunization: Secondary | ICD-10-CM | POA: Diagnosis not present

## 2020-05-03 DIAGNOSIS — R768 Other specified abnormal immunological findings in serum: Secondary | ICD-10-CM | POA: Diagnosis not present

## 2020-05-09 ENCOUNTER — Other Ambulatory Visit: Payer: Self-pay

## 2020-05-09 ENCOUNTER — Ambulatory Visit (INDEPENDENT_AMBULATORY_CARE_PROVIDER_SITE_OTHER): Payer: BC Managed Care – PPO | Admitting: Obstetrics & Gynecology

## 2020-05-09 ENCOUNTER — Encounter: Payer: Self-pay | Admitting: Obstetrics & Gynecology

## 2020-05-09 ENCOUNTER — Other Ambulatory Visit (HOSPITAL_COMMUNITY)
Admission: RE | Admit: 2020-05-09 | Discharge: 2020-05-09 | Disposition: A | Discharge status: Home / Self Care | Payer: BC Managed Care – PPO | Source: Ambulatory Visit | Attending: Obstetrics & Gynecology | Admitting: Obstetrics & Gynecology

## 2020-05-09 ENCOUNTER — Encounter: Payer: BC Managed Care – PPO | Admitting: Obstetrics & Gynecology

## 2020-05-09 VITALS — BP 108/64 | HR 82 | Ht 63.0 in | Wt 115.0 lb

## 2020-05-09 DIAGNOSIS — R87612 Low grade squamous intraepithelial lesion on cytologic smear of cervix (LGSIL): Secondary | ICD-10-CM

## 2020-05-09 DIAGNOSIS — Z01812 Encounter for preprocedural laboratory examination: Secondary | ICD-10-CM | POA: Diagnosis not present

## 2020-05-09 DIAGNOSIS — D069 Carcinoma in situ of cervix, unspecified: Secondary | ICD-10-CM | POA: Diagnosis not present

## 2020-05-09 LAB — POCT URINE PREGNANCY: Preg Test, Ur: NEGATIVE

## 2020-05-09 NOTE — Progress Notes (Signed)
Pt reports a h/o multiple abnormal PAPs (ASCUS) since 2018.  Her last PAP showed LGSIL and she was referred here.  Patient given informed consent, signed copy in the chart, time out was performed.   03/28/2020 PositiveAbnormal    Adequacy Satisfactory for evaluation; transformation zone component PRESENT.   Diagnosis - Low grade squamous intraepithelial lesion (LSIL)Abnormal      Placed in lithotomy position. Cervix viewed with speculum and colposcope after application of acetic acid.  Colposcopy adequate?  yes Acetowhite lesions? yes Punctation? yes Mosaicism?  no Abnormal vasculature?  no Biopsies? Yes x 2 at 1130 and 6:00 ECC? no   Patient was given post procedure instructions.  She will return in 2 weeks for results. Pt was educated about HPV.   Javana Schey L. Harraway-Smith, M.D., [facility]

## 2020-05-09 NOTE — Patient Instructions (Signed)
Colposcopy Colposcopy is a procedure to examine the lowest part of the uterus (cervix), the vagina, and the area around the vaginal opening (vulva) for abnormalities or signs of disease. The procedure is done using a lighted microscope or magnifying lens (colposcope). If any unusual cells are found during the procedure, your health care provider may remove a tissue sample for testing (biopsy). A colposcopy may be done if you:  Have an abnormal Pap test. A Pap test is a screening test that is used to check for signs of cancer or infection of the vagina, cervix, and uterus.  Have a Pap smear test in which you test positive for high-risk HPV (human papillomavirus).  Have a sore or lesion on your cervix.  Have genital warts on your vulva, vagina, or cervix.  Took certain medicines while pregnant, such as diethylstilbestrol (DES).  Have pain during sexual intercourse.  Have vaginal bleeding, especially after sexual intercourse.  Need to have a cervical polyp removed.  Need to have a lost intrauterine device (IUD) string located. Let your health care provider know about:  Any allergies you have, including allergies to prescribed medicine, latex, or iodine.  All medicines you are taking, including vitamins, herbs, eye drops, creams, and over-the-counter medicines. Bring a list of all of your medicines to your appointment.  Any problems you or family members have had with anesthetic medicines.  Any blood disorders you have.  Any surgeries you have had.  Any medical conditions you have, such as pelvic inflammatory disease (PID) or endometrial disorder.  Any history of frequent fainting.  Your menstrual cycle and what form of birth control (contraception) you use.  Your medical history, including any prior cervical treatment.  Whether you are pregnant or may be pregnant. What are the risks? Generally, this is a safe procedure. However, problems may occur,  including:  Pain.  Infection, which may include a fever, bad-smelling discharge, or pelvic pain.  Bleeding or discharge.  Misdiagnosis.  Fainting and vasovagal reactions, but this is rare.  Allergic reactions to medicines.  Damage to other structures or organs. What happens before the procedure?  If you have your menstrual period or will have it at the time of your procedure, tell your health care provider. A colposcopy typically is not done during menstruation.  Continue your contraceptive practices before and after the procedure.  For 24 hours before the colposcopy: ? Do not douche. ? Do not use tampons. ? Do not use medicines, creams, or suppositories in the vagina. ? Do not have sexual intercourse.  Ask your health care provider about: ? Changing or stopping your regular medicines. This is especially important if you are taking diabetes medicines or blood thinners. ? Taking medicines such as aspirin and ibuprofen. These medicines can thin your blood. Do not take these medicines before your procedure if your health care provider instructs you not to. It is likely that your health care provider will tell you to avoid taking aspirin or medicine that contains aspirin for 7 days before the procedure.  Follow instructions from your health care provider about eating or drinking restrictions. You will likely need to eat a regular diet the day of the procedure and not skip any meals.  You may have an exam or testing. A pregnancy test will be taken on the day of the procedure.  You may have a blood or urine sample taken.  Plan to have someone take you home from the hospital or clinic.  If you will be going   home right after the procedure, plan to have someone with you for 24 hours. What happens during the procedure?  You will lie down on your back, with your feet in foot rests (stirrups).  A warmed and lubricated instrument (speculum) will be inserted into your vagina. The  speculum will be used to hold apart the walls of your vagina so your health care provider can see your cervix and the inside of your vagina.  A cotton swab will be used to place a small amount of liquid solution on the areas to be examined. This solution makes it easier to see abnormal cells. You may feel a slight burning during this part.  The colposcope will be used to scan the cervix with a bright white light. The colposcope will be held near your vulvaand will magnify your vulva, vagina, and cervix for easier examination.  Your health care provider may decide to take a biopsy. If so: ? You may be given medicine to numb the area (local anesthetic). ? Surgical instruments will be used to suck out mucus and cells through your vagina. ? You may feel mild pain while the tissue sample is removed. ? Bleeding may occur. A solution may be used to stop the bleeding. ? If a sample of tissue is needed from the inside of the cervix, a different procedure called endocervical curettage (ECC) may be completed. During this procedure, a curved instrument (curette) will be used to scrape cells from your cervix or the top of your cervix (endocervix).  Your health care provider will record the location of any abnormalities. The procedure may vary among health care providers and hospitals. What happens after the procedure?  You will lie down and rest for a few minutes. You may be offered juice or cookies.  Your blood pressure, heart rate, breathing rate, and blood oxygen level will be monitored until any medicines you were given have worn off.  You may have to wear compression stockings. These stockings help to prevent blood clots and reduce swelling in your legs.  You may have some cramping in your abdomen. This should go away after a few minutes. This information is not intended to replace advice given to you by your health care provider. Make sure you discuss any questions you have with your health care  provider. Document Revised: 09/11/2017 Document Reviewed: 05/05/2016 Elsevier Patient Education  2020 [employer] Colposcopy, Care After This sheet gives you information about how to care for yourself after your procedure. Your health care provider may also give you more specific instructions. If you have problems or questions, contact your health care provider. What can I expect after the procedure? If you had a colposcopy without a biopsy, you can expect to feel fine right away, but you may have some spotting for a few days. You can go back to your normal activities. If you had a colposcopy with a biopsy, it is common to have:  Soreness and pain. This may last for a few days.  Light-headedness.  Mild vaginal bleeding or dark-colored, grainy discharge. This may last for a few days. The discharge may be due to a solution that was used during the procedure. You may need to wear a sanitary pad during this time.  Spotting for at least 48 hours after the procedure. Follow these instructions at home:   Take over-the-counter and prescription medicines only as told by your health care provider. Talk with your health care provider about what type of over-the-counter pain medicine and   prescription medicine you can start taking again. It is especially important to talk with your health care provider if you take blood-thinning medicine.  Do not drive or use heavy machinery while taking prescription pain medicine.  For at least 3 days after your procedure, or as long as told by your health care provider, avoid: ? Douching. ? Using tampons. ? Having sexual intercourse.  Continue to use birth control (contraception).  Limit your physical activity for the first day after the procedure as told by your health care provider. Ask your health care provider what activities are safe for you.  It is up to you to get the results of your procedure. Ask your health care provider, or the department  performing the procedure, when your results will be ready.  Keep all follow-up visits as told by your health care provider. This is important. Contact a health care provider if:  You develop a skin rash. Get help right away if:  You are bleeding heavily from your vagina or you are passing blood clots. This includes using more than one sanitary pad per hour for 2 hours in a row.  You have a fever or chills.  You have pelvic pain.  You have abnormal, yellow-colored, or bad-smelling vaginal discharge. This could be a sign of infection.  You have severe pain or cramps in your lower abdomen that do not get better with medicine.  You feel light-headed or dizzy, or you faint. Summary  If you had a colposcopy without a biopsy, you can expect to feel fine right away, but you may have some spotting for a few days. You can go back to your normal activities.  If you had a colposcopy with a biopsy, you may [place] mild pain and spotting for 48 hours after the procedure.  Avoid douching, using tampons, and having sexual intercourse for 3 days after the procedure or as long as told by your health care provider.  Contact your health care provider if you have bleeding, severe pain, or signs of infection. This information is not intended to replace advice given to you by your health care provider. Make sure you discuss any questions you have with your health care provider. Document Revised: 09/11/2017 Document Reviewed: 05/16/2016 Elsevier Patient Education  2020 Elsevier Inc.  

## 2020-05-11 LAB — SURGICAL PATHOLOGY

## 2020-05-24 ENCOUNTER — Telehealth: Payer: Self-pay

## 2020-05-24 DIAGNOSIS — Z23 Encounter for immunization: Secondary | ICD-10-CM | POA: Diagnosis not present

## 2020-05-24 NOTE — Telephone Encounter (Signed)
Pt called requesting Colposcopy results. Pt made aware that her Colpo showed HGSIL, CIN 1, and CIN 2 and she will need to have a LEEP. Understanding was voiced. Geremiah Fussell l Raymie Trani, CMA

## 2020-06-22 ENCOUNTER — Emergency Department (HOSPITAL_BASED_OUTPATIENT_CLINIC_OR_DEPARTMENT_OTHER): Payer: BC Managed Care – PPO

## 2020-06-22 ENCOUNTER — Other Ambulatory Visit: Payer: Self-pay

## 2020-06-22 ENCOUNTER — Encounter (HOSPITAL_BASED_OUTPATIENT_CLINIC_OR_DEPARTMENT_OTHER): Payer: Self-pay | Admitting: Emergency Medicine

## 2020-06-22 DIAGNOSIS — M62830 Muscle spasm of back: Secondary | ICD-10-CM | POA: Diagnosis not present

## 2020-06-22 DIAGNOSIS — J45909 Unspecified asthma, uncomplicated: Secondary | ICD-10-CM | POA: Insufficient documentation

## 2020-06-22 DIAGNOSIS — Y939 Activity, unspecified: Secondary | ICD-10-CM | POA: Diagnosis not present

## 2020-06-22 DIAGNOSIS — M62838 Other muscle spasm: Secondary | ICD-10-CM | POA: Diagnosis not present

## 2020-06-22 DIAGNOSIS — S199XXA Unspecified injury of neck, initial encounter: Secondary | ICD-10-CM | POA: Diagnosis not present

## 2020-06-22 DIAGNOSIS — Y999 Unspecified external cause status: Secondary | ICD-10-CM | POA: Insufficient documentation

## 2020-06-22 DIAGNOSIS — E0590 Thyrotoxicosis, unspecified without thyrotoxic crisis or storm: Secondary | ICD-10-CM | POA: Diagnosis not present

## 2020-06-22 DIAGNOSIS — Y92410 Unspecified street and highway as the place of occurrence of the external cause: Secondary | ICD-10-CM | POA: Insufficient documentation

## 2020-06-22 DIAGNOSIS — Z041 Encounter for examination and observation following transport accident: Secondary | ICD-10-CM | POA: Diagnosis not present

## 2020-06-22 LAB — PREGNANCY, URINE: Preg Test, Ur: NEGATIVE
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
## 2020-06-23 ENCOUNTER — Emergency Department (HOSPITAL_BASED_OUTPATIENT_CLINIC_OR_DEPARTMENT_OTHER)
Admission: EM | Admit: 2020-06-23 | Discharge: 2020-06-23 | Disposition: A | Discharge status: Home / Self Care | Payer: BC Managed Care – PPO | Attending: Emergency Medicine | Admitting: Emergency Medicine

## 2020-06-23 DIAGNOSIS — M62838 Other muscle spasm: Secondary | ICD-10-CM

## 2020-06-23 DIAGNOSIS — M62830 Muscle spasm of back: Secondary | ICD-10-CM

## 2020-06-23 MED ORDER — CYCLOBENZAPRINE HCL 10 MG PO TABS: 10.0000 mg | ORAL_TABLET | Freq: Three times a day (TID) | ORAL | 0 refills | Status: DC | PRN

## 2020-06-23 MED ORDER — NAPROXEN 375 MG PO TABS: ORAL_TABLET | ORAL | 0 refills | Status: DC

## 2020-06-23 MED ORDER — CYCLOBENZAPRINE HCL 10 MG PO TABS
10.0000 mg | ORAL_TABLET | Freq: Once | ORAL | Status: AC
  Administered 2020-06-23: 10 mg via ORAL
  Filled 2020-06-23: qty 1

## 2020-06-23 MED ORDER — NAPROXEN 250 MG PO TABS
500.0000 mg | ORAL_TABLET | Freq: Once | ORAL | Status: AC
  Administered 2020-06-23: 500 mg via ORAL
  Filled 2020-06-23: qty 2
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
## 2020-06-24 ENCOUNTER — Telehealth (HOSPITAL_BASED_OUTPATIENT_CLINIC_OR_DEPARTMENT_OTHER): Payer: Self-pay | Admitting: Emergency Medicine

## 2020-06-26 ENCOUNTER — Encounter: Payer: Self-pay | Admitting: Family Medicine

## 2020-06-29 ENCOUNTER — Encounter: Payer: Self-pay | Admitting: Family Medicine

## 2020-07-18 ENCOUNTER — Other Ambulatory Visit (HOSPITAL_COMMUNITY)
Admission: RE | Admit: 2020-07-18 | Discharge: 2020-07-18 | Disposition: A | Discharge status: Home / Self Care | Payer: BC Managed Care – PPO | Source: Ambulatory Visit | Attending: Obstetrics & Gynecology | Admitting: Obstetrics & Gynecology

## 2020-07-18 ENCOUNTER — Encounter: Payer: Self-pay | Admitting: Obstetrics & Gynecology

## 2020-07-18 ENCOUNTER — Ambulatory Visit (INDEPENDENT_AMBULATORY_CARE_PROVIDER_SITE_OTHER): Payer: BC Managed Care – PPO | Admitting: Obstetrics & Gynecology

## 2020-07-18 ENCOUNTER — Other Ambulatory Visit: Payer: Self-pay

## 2020-07-18 VITALS — BP 122/82 | HR 82 | Ht 63.0 in | Wt 116.0 lb

## 2020-07-18 DIAGNOSIS — Z01812 Encounter for preprocedural laboratory examination: Secondary | ICD-10-CM | POA: Diagnosis not present

## 2020-07-18 DIAGNOSIS — N879 Dysplasia of cervix uteri, unspecified: Secondary | ICD-10-CM | POA: Insufficient documentation

## 2020-07-18 DIAGNOSIS — D069 Carcinoma in situ of cervix, unspecified: Secondary | ICD-10-CM | POA: Diagnosis not present

## 2020-07-18 NOTE — Progress Notes (Signed)
Patient presents for LEEP from pap with HSIL, CIN II-III. Armandina Stammer RN

## 2020-07-18 NOTE — Patient Instructions (Addendum)
Loop Electrosurgical Excision Procedure Loop electrosurgical excision procedure (LEEP) is the cutting and removal (excision) of tissue from the cervix. The cervix is the bottom part of the uterus that opens into the vagina. The tissue that is removed from the cervix is examined to see if there are precancerous cells or cancer cells present. LEEP may be done when:  You have abnormal bleeding from your cervix.  You have an abnormal Pap test result.  Your health care provider finds an abnormality on your cervix during a pelvic exam. LEEP typically only takes a few minutes and is often done in the health care provider's office. The procedure is safe for women who are trying to get pregnant. However, the procedure is usually not done during a menstrual period or during pregnancy. Tell a health care provider about:  Any allergies you have.  All medicines you are taking, including vitamins, herbs, eye drops, creams, and over-the-counter medicines.  Any blood disorders you have.  Any medical conditions you have, including current or past vaginal infections such as herpes or sexually-transmitted infections (STIs).  Whether you are pregnant or may be pregnant.  Whether or not you are having vaginal bleeding on the day of the procedure. What are the risks? Generally, this is a safe procedure. However, problems may occur, including:  Infection.  Bleeding.  Allergic reactions to medicines.  Changes or scarring in the cervix.  Increased risk of early (preterm) labor in future pregnancies. What happens before the procedure?  Ask your health care provider about: ? Changing or stopping your regular medicines. This is especially important if you are taking diabetes medicines or blood thinners. ? Taking medicines such as aspirin and ibuprofen. These medicines can thin your blood. Do not take these medicines unless your health care provider tells you to take them. ? Taking over-the-counter  medicines, vitamins, herbs, and supplements.  Your health care provider may recommend that you take pain medicine before the procedure.  Ask your health care provider if you should plan to have someone take you home after the procedure. What happens during the procedure?   An instrument called a speculum will be placed in your vagina. This will allow your health care provider to see your cervix.  You will be given a medicine to numb the area (local anesthetic). The medicine will be injected into your cervix and the surrounding area.  A solution will be applied to your cervix. This solution will help the health care provider find the abnormal cells that need to be removed.  A thin wire loop will be passed through your vagina. The wire will be used to burn (cauterize) the cervical tissue with an electrical current.  You may feel faint during the procedure. Tell your health care provider right away if you feel this way.  The abnormal cervical tissue will be removed.  Any open blood vessels will be cauterized to prevent bleeding.  A paste may be applied to the cauterized area of your cervix to help prevent bleeding.  The sample of cervical tissue will be examined under a microscope. The procedure may vary among health care providers and hospitals. What can I expect after the procedure? After the procedure, it is common to have:  Mild abdominal cramps that are similar to menstrual cramps. These may last for up to 1 week.  A small amount of pink-tinged or bloody vaginal discharge, including light to moderate bleeding, for 1-2 weeks.  A dark-colored discharge coming from your vagina. This is from   the paste that was used on the cervix to prevent bleeding. It is up to you to get the results of your procedure. Ask your health care provider, or the department that is doing the procedure, when your results will be ready. Follow these instructions at home:  Take over-the-counter and  prescription medicines only as told by your health care provider.  Return to your normal activities as told by your health care provider. Ask your health care provider what activities are safe for you.  Do not put anything in your vagina for 2 weeks after the procedure or until your health care provider says that it is okay. This includes tampons, creams, and douches.  Do not have sex until your health care provider approves.  Keep all follow-up visits as told by your health care provider. This is important. Contact a health care provider if you:  Have a fever or chills.  Feel unusually weak.  Have vaginal bleeding that is heavier or longer than a normal menstrual cycle. A sign of this can be soaking a pad with blood or bleeding with clots.  Develop a bad smelling vaginal discharge.  Have severe abdominal pain or cramping. Summary  Loop electrosurgical excision procedure (LEEP) is the removal of tissue from the cervix. The removed tissue will be checked for precancerous cells or cancer cells.  LEEP typically only takes a few minutes and is often done in the health care provider's office.  Do not put anything in your vagina for 2 weeks after the procedure or until your health care provider says that it is okay. This includes tampons, creams, and douches.  Keep all follow-up visits as told by your health care provider. Ask your health care provider, or the department that is doing the procedure, when your results will be ready. This information is not intended to replace advice given to you by your health care provider. Make sure you discuss any questions you have with your health care provider. Document Revised: 10/22/2018 Document Reviewed: 10/22/2018 Elsevier Patient Education  2020 Elsevier Inc.     Cervical Conization, Care After This sheet gives you information about how to care for yourself after your procedure. Your health care provider may also give you more specific  instructions. If you have problems or questions, contact your health care provider. What can I expect after the procedure? After the procedure, it is common to have:  A groggy feeling, if you were given medicine to make you fall asleep (general anesthetic).  Cramps that feel similar to menstrual cramps.  Bloody discharge or light to moderate bleeding.  Dark discharge. This discharge may look similar to coffee grounds. This is from the paste that was applied to the cervix to control bleeding. Follow these instructions at home: Medicines  Take over-the-counter and prescription medicines only as told by your health care provider.  Do not take aspirin until your health care provider says it is okay. Aspirin can cause bleeding. Activity   Rest as told by your health care provider.  Avoid activities that require great effort, such as exercises and heavy lifting, for at least 7-14 days.  Do not lift anything that is heavier than 10 lb (4.5 kg), or the limit that you are told, until your health care provider says that it is safe.  Return to your normal activities as told by your health care provider. Ask your health care provider what activities are safe for you. General instructions   You may resume your normal diet  unless your health care provider advises you not to do so.  Drink enough fluid to keep your urine pale yellow.  Do not take baths, swim, or use a hot tub until your health care provider approves. Ask your health care provider if you may take showers. You may only be allowed to take sponge baths.  Do not douche, have sex, or put anything in the vagina, including tampons, until your health care provider says it is okay.  Keep all follow-up visits as told by your health care provider. This is important. Contact a health care provider if:  You develop a rash.  You are dizzy or light-headed.  You feel nauseous or you vomit.  You develop a bad-smelling discharge from  your vagina. Get help right away if:  You have blood clots or bleeding that is heavier than a normal menstrual period. Bleeding that soaks a pad in less than 1 hour is considered heavy bleeding.  You have a fever.  You have increasing cramps.  You faint.  You have pain when you urinate.  You have severe or worsening pain that is not relieved when you take medicine.  You have bloody urine. Summary  After the procedure, it is common to have cramps, some bleeding, and dark or bloody discharge from your vagina.  Do not douche, have sex, or put anything in the vagina, including tampons, until your health care provider says it is okay.  Avoid activities that require great effort, such as exercises and heavy lifting, for at least 7-14 days.  Follow all other activity restrictions as told by your health care provider. This information is not intended to replace advice given to you by your health care provider. Make sure you discuss any questions you have with your health care provider. Document Revised: 03/28/2019 Document Reviewed: 03/28/2019 Elsevier Patient Education  2020 [employer].

## 2020-07-18 NOTE — Progress Notes (Signed)
Pap smear and colposcopy reviewed.   Pap 03/28/2020 PositiveAbnormal    Adequacy Satisfactory for evaluation; transformation zone component PRESENT.   Diagnosis - Low grade squamous intraepithelial lesion (LSIL)Abnormal   Comment Normal Reference Range HPV - Negative     Colpo Biopsy 05/09/2020 CERVIX, BIOPSY:  - High grade squamous intraepithelial lesion, CIN II-III (moderate to  severe dysplasia).   Risks, benefits, alternatives, and limitations of procedure explained to patient, including pain, bleeding, infection, failure to remove abnormal tissue and failure to cure dysplasia, need for repeat procedures, damage to pelvic organs, cervical incompetence.  Role of HPV,cervical dysplasia and need for close followup was empasized. Informed written consent was obtained. All questions were answered. Time out performed.  Procedure: The patient was placed in lithotomy position and the bivalved coated speculum was placed in the patient's vagina. A grounding pad placed on the patient. Local anesthesia was administered via an intracervical block using 20cc of 2% Lidocaine with epinephrine. The suction was turned on and the Small loop Cone Biopsy Excisor on 80 Watts of cutting current was used to excise the area of decreased uptake and excise the entire transformation zone. Excellent hemostasis was achieved using roller ball coagulation set at 60 Watts coagulation current. Monsel's solution was then applied and excellent hemostasis was noted.  The speculum was removed from the vagina. Specimens were sent to pathology. The patient tolerated the procedure well. Post-operative instructions given to patient, including instruction to seek medical attention for persistent bright red bleeding, fever, abdominal/pelvic pain, dysuria, nausea or vomiting. She was also told about the possibility of having copious yellow to black tinged discharge. She was counseled to avoid anything in the vagina  (sex/douching/tampons) for 4 weeks.   She has a  4 week post-operative check to review results and assess wound healing.  Ahmere Hemenway L. Harraway-Smith, M.D., [place] [place]

## 2020-07-20 LAB — POCT URINE PREGNANCY: Preg Test, Ur: NEGATIVE

## 2020-07-20 LAB — SURGICAL PATHOLOGY

## 2020-08-08 ENCOUNTER — Other Ambulatory Visit: Payer: Self-pay

## 2020-08-08 ENCOUNTER — Encounter: Payer: Self-pay | Admitting: Obstetrics & Gynecology

## 2020-08-08 ENCOUNTER — Ambulatory Visit (INDEPENDENT_AMBULATORY_CARE_PROVIDER_SITE_OTHER): Payer: BC Managed Care – PPO | Admitting: Obstetrics & Gynecology

## 2020-08-08 VITALS — BP 101/66 | HR 75 | Ht 63.0 in | Wt 118.0 lb

## 2020-08-08 DIAGNOSIS — N879 Dysplasia of cervix uteri, unspecified: Secondary | ICD-10-CM

## 2020-08-08 NOTE — Progress Notes (Signed)
24 y.o. G0P0000 here today for post LEEP check . Pt denies problesm. She has not been sexually active since the procedure was done.  She denies bleeding, pain or abnormal discharge.      The following portions of the patient's history were reviewed and updated as appropriate: allergies, current medications, past family history, past medical history, past social history, past surgical history and problem list.  Review of Systems:  Pertinent items are noted in HPI.    Objective:  Physical Exam BP 101/66   Pulse 75   Ht 5\' 3"  (1.6 m)   Wt 118 lb (53.5 kg)   BMI 20.90 kg/m  No LMP recorded. (Menstrual status: IUD).  CONSTITUTIONAL: Well-developed, well-nourished female in no acute distress.  HENT:  Normocephalic, atraumatic EYES: Conjunctivae and EOM are normal. No scleral icterus.  NECK: Normal range of motion SKIN: Skin is warm and dry. No rash noted. Not diaphoretic.No pallor. NEUROLGIC: Alert and oriented to person, place, and time. Normal coordination.  Pelvic: Normal appearing external genitalia; normal appearing vaginal mucosa and cervix.  Normal discharge.  surgical site healing well.   Labs and Imaging Surg path: 07/18/2020  CERVIX, LEEP:  - High grade squamous intraepithelial lesion, CIN II-III (moderate to  severe dysplasia/CIS).   Assessment & Plan:  4 week post LEEP check  Doing well  surg path reviewed.    F/u in 4 months for repeat PAP or sooner prn  Virdell Hoiland L. Harraway-Smith, M.D., 09/17/2020

## 2020-08-08 NOTE — Patient Instructions (Signed)
Cervical Dysplasia  Cervical dysplasia is a condition in which a woman's cervix cells have abnormal changes. The cervix is the opening of the uterus (womb). It is located between the vagina and the uterus. Cervical dysplasia may be an early sign of cervical cancer. If left untreated, this condition may become more severe and may progress to cervical cancer. Early detection, treatment, and follow-up care are very important. What are the causes? Cervical dysplasia can be caused by a human papillomavirus (HPV) infection. HPV is the most common sexually transmitted infection (STI). HPV is spread from person to person through sexual contact. This includes oral, vaginal, or anal sex. What increases the risk? The following factors may make you more likely to develop this condition:  Having had a sexually transmitted infection (STI), such as herpes, chlamydia or HPV.  Becoming sexually active before age 18.  Having had more than one sexual partner.  Having a sexual partner who has multiple sexual partners.  Not using protection, such as a condom, during sex, especially with new sexual partners.  Having a history of cancer of the vagina or vulva.  Having a weakened body defense (immune) system.  Being the daughter of a woman who took diethylstilbestrol (DES) during pregnancy.  Having a family history of cervical cancer.  Smoking.  Using oral contraceptives, also called birth control pills.  Having had three or more full-term pregnancies. What are the signs or symptoms? There are usually no symptoms of this condition. If you do have symptoms, they may include:  Abnormal vaginal discharge.  Bleeding between periods or after sex.  Bleeding during menopause.  Pain during sex (dyspareunia). How is this diagnosed? A test called a Pap test may be done. During this test, cells are taken from the cervix and then examined under a microscope. A test in which tissue is removed from the cervix  (biopsy) may also be done if the Pap test is abnormal or if the cervix looks abnormal. How is this treated? Treatment varies based on the severity of the condition. Treatment may include:  Cryotherapy. During cryotherapy, the abnormal cells are frozen with a steel-tip instrument.  Loop electrosurgical excision procedure (LEEP). This procedure removes abnormal tissue from the cervix.  Surgery to remove abnormal tissue. This is usually done in more advanced cases. Surgical options include: ? A cone biopsy. This is a procedure in which the cervical canal and a portion of the center of the cervix are removed. ? Hysterectomy. This is a surgery in which the uterus and cervix are removed. Follow these instructions at home:  Take over-the-counter and prescription medicines only as told by your health care provider.  Do not use tampons, have sex, or douche until your health care provider says it is okay.  Keep follow-up visits as told by your health care provider. This is important. Women who have been treated for cervical dysplasia should have regular pelvic exams and Pap tests as told by their health care provider. How is this prevented?  Practice safe sex to help prevent sexually transmitted infections (STI) that may cause this condition.  Have regular Pap tests. Talk with your health care provider about how often you need these tests. Pap tests will help identify cell changes that can lead to cancer. Contact a health care provider if:  You develop genital warts. Get help right away if:  You have a fever.  You have abnormal vaginal discharge.  Your menstrual period is heavier than normal.  You develop bright red bleeding.   This may include blood clots.  You have pain or cramps that get worse, and medicine does not help to relieve your pain.  You feel light-headed and you are unusually weak.  You have fainting spells.  You have pain in the abdomen. Summary  Cervical dysplasia is  a condition in which a woman's cervix cells have abnormal changes.  If left untreated, this condition may become more severe and may progress to cervical cancer.  Early detection, treatment, and follow-up care are very important in managing this condition.  Have regular pelvic exams and Pap tests. Talk with your health care provider about how often you need these tests. Pap tests will help identify cell changes that can lead to cancer. This information is not intended to replace advice given to you by your health care provider. Make sure you discuss any questions you have with your health care provider. Document Revised: 07/21/2018 Document Reviewed: 10/02/2016 Elsevier Patient Education  2020 Elsevier Inc.  

## 2020-11-06 ENCOUNTER — Telehealth: Payer: Self-pay | Admitting: General Practice

## 2020-11-06 NOTE — Telephone Encounter (Signed)
Left message VM for patient to give our office a call to schedule 4 month follow up with Dr. Erin Fulling.  [facility] message as well.

## 2020-11-08 ENCOUNTER — Telehealth (INDEPENDENT_AMBULATORY_CARE_PROVIDER_SITE_OTHER): Payer: Self-pay | Admitting: Family Medicine

## 2020-11-08 ENCOUNTER — Other Ambulatory Visit: Payer: Self-pay

## 2020-11-08 DIAGNOSIS — L293 Anogenital pruritus, unspecified: Secondary | ICD-10-CM

## 2020-11-08 DIAGNOSIS — Z8639 Personal history of other endocrine, nutritional and metabolic disease: Secondary | ICD-10-CM

## 2020-11-08 MED ORDER — FLUCONAZOLE 150 MG PO TABS: 150.0000 mg | ORAL_TABLET | Freq: Once | ORAL | 0 refills | Status: AC

## 2020-11-08 NOTE — Progress Notes (Signed)
[facility] [address] [place], [place] [address] [phone]  Date:  11/08/2020   Name:  Lindsay Walker   DOB:  01-Jan-1996   MRN:  [id number]  PCP:  Pearline Cables, MD    Chief Complaint: No chief complaint on file.   History of Present Illness:  Lindsay Walker is a 25 y.o. very pleasant female patient who presents with the following:  Patient location is home, provider location is home.  Patient identity confirmed with 2 factors, she gives consent for virtual visit today.  The patient myself are present on the visit today  Last by myself in June for physical History of hyperthyroidism/Graves' disease, asthma She also has history of strongly positive ANA, associate with an unusual rash on her toes.  I referred her to rheumatology previously but she did not end up being seen  Virtual visit today for concern of itchy skin over her clitoris.  It has been itchy for about 2 months.  Just that area- everything else is ok  She has tried using a diaper cream and over-the-counter yeast cream No discharge No bumps or redness No urinary symptoms.  She has an IUD  She also has noticed herself feeling a bit jittery and having more difficulty sleeping recently, she notes these were also symptoms of Graves' disease in the past, would be interested in having her thyroid labs rechecked   Lab Results  Component Value Date   TSH 0.57 08/17/2019     Patient Active Problem List   Diagnosis Date Noted  . Hyperthyroidism 09/16/2015    Past Medical History:  Diagnosis Date  . Asthma   . Hyperthyroidism   . Vaginal Pap smear, abnormal    HSIL CINii-III    Past Surgical History:  Procedure Laterality Date  . TONSILECTOMY/ADENOIDECTOMY WITH MYRINGOTOMY      Social History   Tobacco Use  . Smoking status: Never Smoker  . Smokeless tobacco: Never Used  Substance Use Topics  . Alcohol use: Yes    Alcohol/week: 0.0  standard drinks  . Drug use: No    Family History  Problem Relation Age of Onset  . Asthma Mother   . Hypertension Mother   . Thyroid disease Neg Hx     Allergies  Allergen Reactions  . Penicillins     Hives Swelling     Medication list has been reviewed and updated.  Current Outpatient Medications on File Prior to Visit  Medication Sig Dispense Refill  . cetirizine (ZYRTEC) 10 MG tablet Take 1 tablet (10 mg total) by mouth daily. 90 tablet 3  . cyclobenzaprine (FLEXERIL) 10 MG tablet Take 1 tablet (10 mg total) by mouth 3 (three) times daily as needed for muscle spasms. (Patient not taking: Reported on 08/08/2020) 20 tablet 0  . levonorgestrel (MIRENA) 20 MCG/24HR IUD 1 each by Intrauterine route once.    . naproxen (NAPROSYN) 375 MG tablet Take 1 tablet twice daily as needed for pain. (Patient not taking: Reported on 08/08/2020) 20 tablet 0  . VENTOLIN HFA 108 (90 Base) MCG/ACT inhaler INHALE 2 PUFFS INTO THE LUNGS EVERY 6 HOURS AS NEEDED FOR WHEEZING OR SHORTNESS OF BREATH 18 g 3   No current facility-administered medications on file prior to visit.    Review of Systems:  As per HPI- otherwise negative.   Physical Examination: There were no vitals filed for this visit. There were no vitals filed for this  visit. There is no height or weight on file to calculate BMI. Ideal Body Weight:    Patient observed her video monitor.  She looks well, no distress, no wheezing or shortness of breath noted  Assessment and Plan: History of Graves' disease - Plan: TSH, T4, free  Perineal itching, female - Plan: fluconazole (DIFLUCAN) 150 MG tablet  Virtual visit today for concern of perineal itching.  This is a difficult issue to evaluate virtually.  Discussed with patient, she does not have any dangerous symptoms.  I suggested that she try a low potency cortisone cream such as Gynecort.  Also will try an oral Diflucan in case this may be monilia related.  However if not better in  several days we should see her in the office.  I also ordered a TSH and free T4 for her to have done at her convenience The patient is actually moved to [facility].  She is plan to establish care with a doctor closer to home.  I advised her that an urgent care could most likely also assist with her current concerns  Video used for duration of visit today   Signed Abbe Amsterdam, MD

## 2020-11-23 ENCOUNTER — Other Ambulatory Visit (INDEPENDENT_AMBULATORY_CARE_PROVIDER_SITE_OTHER): Payer: Self-pay

## 2020-11-23 ENCOUNTER — Other Ambulatory Visit: Payer: Self-pay

## 2020-11-23 ENCOUNTER — Encounter: Payer: Self-pay | Admitting: Family Medicine

## 2020-11-23 DIAGNOSIS — Z8639 Personal history of other endocrine, nutritional and metabolic disease: Secondary | ICD-10-CM

## 2020-11-23 LAB — TSH: TSH: 0.62 u[IU]/mL (ref 0.35–4.50)

## 2020-11-23 LAB — T4, FREE: Free T4: 0.68 ng/dL (ref 0.60–1.60)

## 2020-11-26 ENCOUNTER — Telehealth: Payer: Self-pay | Admitting: Endocrinology

## 2020-11-26 NOTE — Telephone Encounter (Signed)
Patient called to advise that she had Thyroid labs done 11/24/19 with Dr Patsy Lager.  Her TSH and T4 labs were a little low and she is wondering if she should start her medicine again.  Please contact patient [phone]

## 2020-11-28 NOTE — Telephone Encounter (Signed)
Please advise 

## 2020-11-28 NOTE — Telephone Encounter (Signed)
Needs f/u.  Vv today or tomorrow is OK

## 2020-11-29 NOTE — Telephone Encounter (Signed)
Routed to front office to set an appt.

## 2020-12-13 MED ORDER — CETIRIZINE HCL 10 MG PO TABS: 10.0000 mg | ORAL_TABLET | Freq: Every day | ORAL | 3 refills | Status: AC

## 2020-12-13 NOTE — Addendum Note (Signed)
Addended by: Abbe Amsterdam C on: 12/13/2020 08:26 PM   Modules accepted: Orders

## 2020-12-19 ENCOUNTER — Encounter: Payer: Self-pay | Admitting: Obstetrics & Gynecology

## 2020-12-19 ENCOUNTER — Other Ambulatory Visit: Payer: Self-pay

## 2020-12-19 ENCOUNTER — Ambulatory Visit (INDEPENDENT_AMBULATORY_CARE_PROVIDER_SITE_OTHER): Payer: 59 | Admitting: Obstetrics & Gynecology

## 2020-12-19 ENCOUNTER — Other Ambulatory Visit (HOSPITAL_COMMUNITY)
Admission: RE | Admit: 2020-12-19 | Discharge: 2020-12-19 | Disposition: A | Discharge status: Home / Self Care | Payer: 59 | Source: Ambulatory Visit | Attending: Obstetrics & Gynecology | Admitting: Obstetrics & Gynecology

## 2020-12-19 VITALS — BP 96/66 | HR 79 | Ht 63.0 in | Wt 111.0 lb

## 2020-12-19 DIAGNOSIS — N871 Moderate cervical dysplasia: Secondary | ICD-10-CM

## 2020-12-19 NOTE — Progress Notes (Signed)
History:  25 y.o. G0P0000 here today for repeat PAP. Pt is s/p LEEP with pos margins and is here for a f/u PAP. She denies new complaints. She has spotting irreg (has LnIUD). Per pt the strings were not visible prior to the LEEP.   Pt denies other sx.   The following portions of the patient's history were reviewed and updated as appropriate: allergies, current medications, past family history, past medical history, past social history, past surgical history and problem list.  Review of Systems:  Pertinent items are noted in HPI.    Objective:  Physical Exam Blood pressure 96/66, pulse 79, height 5\' 3"  (1.6 m), weight 111 lb (50.3 kg).  CONSTITUTIONAL: Well-developed, well-nourished female in no acute distress.  HENT:  Normocephalic, atraumatic EYES: Conjunctivae and EOM are normal. No scleral icterus.  NECK: Normal range of motion SKIN: Skin is warm and dry. No rash noted. Not diaphoretic.No pallor. NEUROLGIC: Alert and oriented to person, place, and time. Normal coordination.   Pelvic: Normal appearing external genitalia; normal appearing vaginal mucosa and cervix.  Normal discharge.  Small uterus, no other palpable masses, no uterine or adnexal tenderness  Labs and Imaging 07/18/2020 CERVIX, LEEP:  - High grade squamous intraepithelial lesion, CIN II-III (moderate to  severe dysplasia/CIS).    COMMENT:   High grade squamous dysplasia extends to the ecto- and endocervical  resection margins.   Assessment & Plan:  Cervical dysplasia s/p repeat PAP  F/u PAP   Jahmiyah Dullea L. Harraway-Smith, M.D., 09/17/2020

## 2020-12-19 NOTE — Progress Notes (Signed)
Patient presents for repeat pap after Leep done in Oct 2021.

## 2020-12-24 LAB — CYTOLOGY - PAP
Comment: NEGATIVE
Diagnosis: NEGATIVE
High risk HPV: NEGATIVE
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
## 2021-04-25 ENCOUNTER — Telehealth: Payer: Self-pay | Admitting: General Practice

## 2021-04-25 NOTE — Telephone Encounter (Signed)
Left message on VM for patient to contact our office to schedule 6 month follow up with Dr. Erin Fulling in September.

## 2022-04-09 ENCOUNTER — Encounter: Payer: Self-pay | Admitting: Family Medicine

## 2022-05-14 ENCOUNTER — Ambulatory Visit: Payer: Self-pay | Admitting: Obstetrics & Gynecology

## 2022-05-18 NOTE — Progress Notes (Signed)
[facility] [address], [address] [place], [place] [address] [phone]  Date:  05/21/2022   Name:  Lindsay Walker   DOB:  25-Jun-1996   MRN:  [id number]  PCP:  Pearline Cables, MD    Chief Complaint: Annual Exam (Concerns/ questions: 1. chest pain concern, sob started 2 weeks ago. /)   History of Present Illness:  Lindsay Walker is a 26 y.o. very pleasant female patient who presents with the following:  Pt seen today for an OV Last seen by myself virtually in January  History of hyperthyroidism/Graves' disease, asthma Her asthma has been under good control recently- she has just albuterol prn which she does not generally use  She was seen by Dr Burnice LoganKatrinka Blazing in March to follow-up abnormal pap IUD in place  She is S/p LEEP but had a negative pap in March , - HPV She was actually seen by gynecology again today but did not need a repeat Pap  Need to update BW today  Not currently on any thyroid medication  She notes SOB but about 2 weeks- however this does not seem asthma related, albuterol does not help  SOB may last for a minute or two  Will come and go without any particular pattern-nonexertional  She is having chest pain - she has noted this for nearly a year  It will also come and go Non exertional  Episodes of pain last less than a minute  She does not feel she is wheezing   Married, 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  Non smoker IUD- pregnancy not suspected   Patient Active Problem List   Diagnosis Date Noted   Hyperthyroidism 09/16/2015    Past Medical History:  Diagnosis Date   Asthma    Hyperthyroidism    Vaginal Pap smear, abnormal    HSIL CINii-III    Past Surgical History:  Procedure Laterality Date   TONSILECTOMY/ADENOIDECTOMY WITH MYRINGOTOMY      Social History   Tobacco Use   Smoking status: Never   Smokeless tobacco: Never  Substance Use Topics   Alcohol use:  Yes    Alcohol/week: 0.0 standard drinks of alcohol   Drug use: No    Family History  Problem Relation Age of Onset   Asthma Mother    Hypertension Mother    Thyroid disease Neg Hx     Allergies  Allergen Reactions   Penicillins     Hives Swelling     Medication list has been reviewed and updated.  Current Outpatient Medications on File Prior to Visit  Medication Sig Dispense Refill   cetirizine (ZYRTEC) 10 MG tablet Take 1 tablet (10 mg total) by mouth daily. 90 tablet 3   levonorgestrel (MIRENA) 20 MCG/24HR IUD 1 each by Intrauterine route once.     VENTOLIN HFA 108 (90 Base) MCG/ACT inhaler INHALE 2 PUFFS INTO THE LUNGS EVERY 6 HOURS AS NEEDED FOR WHEEZING OR SHORTNESS OF BREATH 18 g 3   No current facility-administered medications on file prior to visit.    Review of Systems:  As per HPI- otherwise negative.  BP Readings from Last 3 Encounters:  05/21/22 98/60  05/21/22 112/81  12/19/20 96/66    Physical Examination: Vitals:   05/21/22 1313  BP: 98/60  Pulse: 79  Resp: 18  Temp: 97.8 F (36.6 C)  SpO2: 99%   Vitals:   05/21/22 1313  Weight: 110  lb (49.9 kg)  Height: 5\' 3"  (1.6 m)   Body mass index is 19.49 kg/m. Ideal Body Weight: Weight in (lb) to have BMI = 25: 140.8  GEN: no acute distress. Petite build, looks well  HEENT: Atraumatic, Normocephalic.  Bilateral TM wnl, oropharynx normal.  PEERL,EOMI.   Ears and Nose: No external deformity. CV: RRR, No M/G/R. No JVD. No thrill. No extra heart sounds. PULM: CTA B, no wheezes, crackles, rhonchi. No retractions. No resp. distress. No accessory muscle use. ABD: S, NT, ND, +BS. No rebound. No HSM. EXTR: No c/c/e PSYCH: Normally interactive. Conversant.   EKG: NSR, no old tracing for comparison  Assessment and Plan: Screening for diabetes mellitus - Plan: Comprehensive metabolic panel, Hemoglobin A1c  Screening for deficiency anemia - Plan: CBC  Screening for hyperlipidemia - Plan: Lipid  panel  Hyperthyroidism - Plan: TSH, T3, free  Chest pain, unspecified type - Plan: EKG 12-Lead, Sedimentation rate  SOB (shortness of breath) - Plan: DG Chest 2 View, D-Dimer, Quantitative Visit today for labs and to discuss CP for one year and SOB for about 2 weeks-converted from a physical exam due to patient concerns Routine labs are pending as above EKG is reassuring Discussed possible etiology-asthma with atypical symptoms, costochondritis, anxiety, anemia, thyroid dysfunction.  Less likely pericarditis or a pulmonary embolism.  EKG does not suggest pericarditis, will obtain sedimentation rate We discussed a D-dimer and she would like to go ahead and have this done She agrees to seek care if her symptoms should get worse, otherwise I will be in touch with results   Signed , MD Received chest x-ray and D-dimer, message to patient DG Chest 2 View  Result Date: 05/21/2022 CLINICAL DATA:  Shortness of breath EXAM: CHEST - 2 VIEW COMPARISON:  None Available. FINDINGS: The heart size and mediastinal contours are within normal limits. Both lungs are clear. The visualized skeletal structures are unremarkable. IMPRESSION: Normal chest radiographs. Electronically Signed   By: 07/21/2022 D.O.   On: 05/21/2022 15:34    Results for orders placed or performed in visit on 05/21/22  D-Dimer, Quantitative  Result Value Ref Range   D-Dimer, Quant <0.19 <0.50 mcg/mL FEU   Addnd 8/10- received more labs, message to pt  Results for orders placed or performed in visit on 05/21/22  CBC  Result Value Ref Range   WBC 3.1 (L) 4.0 - 10.5 K/uL   RBC 4.45 3.87 - 5.11 Mil/uL   Platelets 162.0 150.0 - 400.0 K/uL   Hemoglobin 11.7 (L) 12.0 - 15.0 g/dL   HCT 07/21/22 67.3 - 41.9 %   MCV 83.1 78.0 - 100.0 fl   MCHC 31.6 30.0 - 36.0 g/dL   RDW 37.9 02.4 - 09.7 %  Comprehensive metabolic panel  Result Value Ref Range   Sodium 139 135 - 145 mEq/L   Potassium 4.5 3.5 - 5.1 mEq/L   Chloride  104 96 - 112 mEq/L   CO2 28 19 - 32 mEq/L   Glucose, Bld 90 70 - 99 mg/dL   BUN 13 6 - 23 mg/dL   Creatinine, Ser 35.3 0.40 - 1.20 mg/dL   Total Bilirubin 0.4 0.2 - 1.2 mg/dL   Alkaline Phosphatase 35 (L) 39 - 117 U/L   AST 13 0 - 37 U/L   ALT 10 0 - 35 U/L   Total Protein 7.5 6.0 - 8.3 g/dL   Albumin 4.4 3.5 - 5.2 g/dL   GFR 2.99 242.68 mL/min   Calcium 9.4  8.4 - 10.5 mg/dL  Hemoglobin L8V  Result Value Ref Range   Hgb A1c MFr Bld 5.8 4.6 - 6.5 %  Lipid panel  Result Value Ref Range   Cholesterol 116 0 - 200 mg/dL   Triglycerides 56.4 0.0 - 149.0 mg/dL   HDL 33.29 >51.88 mg/dL   VLDL 41.6 0.0 - 60.6 mg/dL   LDL Cholesterol 55 0 - 99 mg/dL   Total CHOL/HDL Ratio 2    NonHDL 66.46   TSH  Result Value Ref Range   TSH 0.07 (L) 0.35 - 5.50 uIU/mL  T3, free  Result Value Ref Range   T3, Free 3.4 2.3 - 4.2 pg/mL  D-Dimer, Quantitative  Result Value Ref Range   D-Dimer, Quant <0.19 <0.50 mcg/mL FEU  Sedimentation rate  Result Value Ref Range   Sed Rate 6 0 - 20 mm/hr

## 2022-05-18 NOTE — Patient Instructions (Addendum)
Good to see you today- I will be in touch with your labs asap If your D dimer is + we will get a CT of your chest If anything is changing or getting worse please seek care

## 2022-05-21 ENCOUNTER — Encounter: Payer: Self-pay | Admitting: Obstetrics & Gynecology

## 2022-05-21 ENCOUNTER — Encounter: Payer: No Typology Code available for payment source | Admitting: Family Medicine

## 2022-05-21 ENCOUNTER — Ambulatory Visit (HOSPITAL_BASED_OUTPATIENT_CLINIC_OR_DEPARTMENT_OTHER)
Admission: RE | Admit: 2022-05-21 | Discharge: 2022-05-21 | Disposition: A | Discharge status: Home / Self Care | Payer: No Typology Code available for payment source | Source: Ambulatory Visit | Attending: Family Medicine | Admitting: Family Medicine

## 2022-05-21 ENCOUNTER — Encounter: Payer: Self-pay | Admitting: Family Medicine

## 2022-05-21 ENCOUNTER — Ambulatory Visit (INDEPENDENT_AMBULATORY_CARE_PROVIDER_SITE_OTHER): Payer: No Typology Code available for payment source | Admitting: Family Medicine

## 2022-05-21 ENCOUNTER — Ambulatory Visit (INDEPENDENT_AMBULATORY_CARE_PROVIDER_SITE_OTHER): Payer: No Typology Code available for payment source | Admitting: Obstetrics & Gynecology

## 2022-05-21 VITALS — BP 98/60 | HR 79 | Temp 97.8°F | Resp 18 | Ht 63.0 in | Wt 110.0 lb

## 2022-05-21 VITALS — BP 112/81 | HR 74 | Ht 63.0 in | Wt 111.0 lb

## 2022-05-21 DIAGNOSIS — Z01419 Encounter for gynecological examination (general) (routine) without abnormal findings: Secondary | ICD-10-CM | POA: Diagnosis not present

## 2022-05-21 DIAGNOSIS — Z13220 Encounter for screening for lipoid disorders: Secondary | ICD-10-CM | POA: Diagnosis not present

## 2022-05-21 DIAGNOSIS — Z87410 Personal history of cervical dysplasia: Secondary | ICD-10-CM | POA: Diagnosis not present

## 2022-05-21 DIAGNOSIS — R079 Chest pain, unspecified: Secondary | ICD-10-CM | POA: Diagnosis not present

## 2022-05-21 DIAGNOSIS — E0590 Thyrotoxicosis, unspecified without thyrotoxic crisis or storm: Secondary | ICD-10-CM

## 2022-05-21 DIAGNOSIS — Z130 Encounter for screening for diseases of the blood and blood-forming organs and certain disorders involving the immune mechanism: Secondary | ICD-10-CM

## 2022-05-21 DIAGNOSIS — Z131 Encounter for screening for diabetes mellitus: Secondary | ICD-10-CM | POA: Diagnosis not present

## 2022-05-21 DIAGNOSIS — R0602 Shortness of breath: Secondary | ICD-10-CM | POA: Diagnosis present

## 2022-05-21 DIAGNOSIS — Z0000 Encounter for general adult medical examination without abnormal findings: Secondary | ICD-10-CM

## 2022-05-21 LAB — D-DIMER, QUANTITATIVE: D-Dimer, Quant: <0.19 mcg/mL FEU (ref <0.50)

## 2022-05-21 NOTE — Progress Notes (Signed)
Subjective:     Lindsay Walker is a 26 y.o. female here for a routine exam.  Current complaints: none. Pt has the Mirena IUD. She has been married for 2 years. She may want to conceive in 3 years or so. 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 Pt has a h/o abnormal PA with high grade dyplasia on LEEP.       Gynecologic History No LMP recorded. (Menstrual status: IUD). Contraception: IUD Last Pap: 12/19/2020. Results were: normal prev LEEP 10/2019 Last mammogram: n/a.   Obstetric History OB History  Gravida Para Term Preterm AB Living  0 0 0 0 0 0  SAB IAB Ectopic Multiple Live Births  0 0 0 0 0     The following portions of the patient's history were reviewed and updated as appropriate: allergies, current medications, past family history, past medical history, past social history, past surgical history, and problem list.  Review of Systems Pertinent items are noted in HPI.    Objective:  BP 112/81   Pulse 74   Ht 5\' 3"  (1.6 m)   Wt 111 lb (50.3 kg)   BMI 19.66 kg/m  General Appearance:    Alert, cooperative, no distress, appears stated age  Head:    Normocephalic, without obvious abnormality, atraumatic  Eyes:    conjunctiva/corneas clear, EOM's intact, both eyes  Ears:    Normal external ear canals, both ears  Nose:   Nares normal, septum midline, mucosa normal, no drainage    or sinus tenderness  Throat:   Lips, mucosa, and tongue normal; teeth and gums normal  Neck:   Supple, symmetrical, trachea midline, no adenopathy;    thyroid:  no enlargement/tenderness/nodules  Back:     Symmetric, no curvature, ROM normal, no CVA tenderness  Lungs:     respirations unlabored  Chest Wall:    No tenderness or deformity   Heart:    Regular rate and rhythm  Breast Exam:    No tenderness, masses, or nipple abnormality  Abdomen:     Soft, non-tender, bowel sounds active all four quadrants,    no masses, no organomegaly  Genitalia:    Normal female without lesion,  discharge or tenderness   Uterus small and mobile    Extremities:   Extremities normal, atraumatic, no cyanosis or edema  Pulses:   2+ and symmetric all extremities  Skin:   Skin color, texture, turgor normal, no rashes or lesions     Assessment:    Healthy female exam.    Plan:  Lindsay Walker was seen today for gynecologic exam.  Diagnoses and all orders for this visit:  Well female exam with routine gynecological exam  History of cervical dysplasia   Need PAP at [facility].   F/u in 1 year or sooner prn   Lindsay Walker L. Harraway-Smith, M.D., [facility]

## 2022-05-22 ENCOUNTER — Encounter: Payer: Self-pay | Admitting: Family Medicine

## 2022-05-22 DIAGNOSIS — E0590 Thyrotoxicosis, unspecified without thyrotoxic crisis or storm: Secondary | ICD-10-CM

## 2022-05-22 LAB — SEDIMENTATION RATE: Sed Rate: 6 mm/hr (ref 0–20)

## 2022-05-22 LAB — LIPID PANEL
Cholesterol: 116 mg/dL (ref 0–200)
HDL: 49.5 mg/dL (ref >39.00)
LDL Cholesterol: 55 mg/dL (ref 0–99)
NonHDL: 66.46
Total CHOL/HDL Ratio: 2
Triglycerides: 57 mg/dL (ref 0.0–149.0)
VLDL: 11.4 mg/dL (ref 0.0–40.0)

## 2022-05-22 LAB — COMPREHENSIVE METABOLIC PANEL
ALT: 10 U/L (ref 0–35)
AST: 13 U/L (ref 0–37)
Albumin: 4.4 g/dL (ref 3.5–5.2)
Alkaline Phosphatase: 35 U/L — ABNORMAL LOW (ref 39–117)
BUN: 13 mg/dL (ref 6–23)
CO2: 28 mEq/L (ref 19–32)
Calcium: 9.4 mg/dL (ref 8.4–10.5)
Chloride: 104 mEq/L (ref 96–112)
Creatinine, Ser: 0.72 mg/dL (ref 0.40–1.20)
GFR: 115.26 mL/min (ref >60.00)
Glucose, Bld: 90 mg/dL (ref 70–99)
Potassium: 4.5 mEq/L (ref 3.5–5.1)
Sodium: 139 mEq/L (ref 135–145)
Total Bilirubin: 0.4 mg/dL (ref 0.2–1.2)
Total Protein: 7.5 g/dL (ref 6.0–8.3)

## 2022-05-22 LAB — CBC
HCT: 36.9 % (ref 36.0–46.0)
Hemoglobin: 11.7 g/dL — ABNORMAL LOW (ref 12.0–15.0)
MCHC: 31.6 g/dL (ref 30.0–36.0)
MCV: 83.1 fl (ref 78.0–100.0)
Platelets: 162 10*3/uL (ref 150.0–400.0)
RBC: 4.45 Mil/uL (ref 3.87–5.11)
RDW: 14.2 % (ref 11.5–15.5)
WBC: 3.1 10*3/uL — ABNORMAL LOW (ref 4.0–10.5)

## 2022-05-22 LAB — TSH: TSH: 0.07 u[IU]/mL — ABNORMAL LOW (ref 0.35–5.50)

## 2022-05-22 LAB — T3, FREE: T3, Free: 3.4 pg/mL (ref 2.3–4.2)

## 2022-05-22 LAB — HEMOGLOBIN A1C: Hgb A1c MFr Bld: 5.8 % (ref 4.6–6.5)

## 2022-05-23 NOTE — Addendum Note (Signed)
Addended by: Abbe Amsterdam C on: 05/23/2022 12:50 PM   Modules accepted: Orders

## 2022-07-01 DIAGNOSIS — E0500 Thyrotoxicosis with diffuse goiter without thyrotoxic crisis or storm: Secondary | ICD-10-CM | POA: Insufficient documentation

## 2022-07-01 DIAGNOSIS — J4520 Mild intermittent asthma, uncomplicated: Secondary | ICD-10-CM | POA: Insufficient documentation

## 2022-07-01 HISTORY — DX: Mild intermittent asthma, uncomplicated: J45.20

## 2022-07-01 HISTORY — DX: Thyrotoxicosis with diffuse goiter without thyrotoxic crisis or storm: E05.00

## 2022-07-16 ENCOUNTER — Ambulatory Visit: Payer: No Typology Code available for payment source | Admitting: Obstetrics & Gynecology

## 2022-08-03 ENCOUNTER — Encounter: Payer: Self-pay | Admitting: Family Medicine

## 2022-08-04 ENCOUNTER — Ambulatory Visit: Payer: No Typology Code available for payment source | Admitting: Obstetrics and Gynecology

## 2022-08-04 ENCOUNTER — Ambulatory Visit (INDEPENDENT_AMBULATORY_CARE_PROVIDER_SITE_OTHER): Payer: No Typology Code available for payment source | Admitting: Obstetrics and Gynecology

## 2022-08-04 ENCOUNTER — Other Ambulatory Visit (HOSPITAL_COMMUNITY)
Admission: RE | Admit: 2022-08-04 | Discharge: 2022-08-04 | Disposition: A | Discharge status: Home / Self Care | Payer: No Typology Code available for payment source | Source: Ambulatory Visit | Attending: Obstetrics & Gynecology | Admitting: Obstetrics & Gynecology

## 2022-08-04 ENCOUNTER — Encounter: Payer: Self-pay | Admitting: Obstetrics and Gynecology

## 2022-08-04 VITALS — BP 99/68 | HR 70 | Ht 63.0 in | Wt 116.0 lb

## 2022-08-04 DIAGNOSIS — Z87410 Personal history of cervical dysplasia: Secondary | ICD-10-CM

## 2022-08-04 MED ORDER — ALBUTEROL SULFATE HFA 108 (90 BASE) MCG/ACT IN AERS: 2.0000 | INHALATION_SPRAY | Freq: Four times a day (QID) | RESPIRATORY_TRACT | 3 refills | Status: DC | PRN

## 2022-08-04 NOTE — Progress Notes (Signed)
   CLINIC ENCOUNTER NOTE  History:  26 y.o. G0P0000 here today for repeat pap smear. No abnormal bleeding. IUD in place to help with PCOS symptoms, no complaints. No additional changes in health since last visit.   Past Medical History:  Diagnosis Date   Asthma    Hyperthyroidism    Vaginal Pap smear, abnormal    HSIL CINii-III    Past Surgical History:  Procedure Laterality Date   TONSILECTOMY/ADENOIDECTOMY WITH MYRINGOTOMY      The following portions of the patient's history were reviewed and updated as appropriate: allergies, current medications, past family history, past medical history, past social history, past surgical history and problem list.   Health Maintenance:  05/2019 LSIL HPV + PCP 03/2020 LSIL, HRHPV + 04/2020 bx CIN II-III 07/2020 CIN II-III, pos margins 12/2020 NILM, HPV -  Review of Systems:  Pertinent items are noted in HPI. Comprehensive review of systems was otherwise negative.   Objective:  Physical Exam BP 99/68   Pulse 70   Ht 5\' 3"  (1.6 m)   Wt 116 lb (52.6 kg)   BMI 20.55 kg/m    Physical Exam Vitals and nursing note reviewed. Exam conducted with a chaperone present.  Constitutional:      Appearance: Normal appearance.  HENT:     Head: Normocephalic and atraumatic.  Cardiovascular:     Rate and Rhythm: Normal rate and regular rhythm.  Pulmonary:     Effort: Pulmonary effort is normal.     Breath sounds: Normal breath sounds.  Genitourinary:    General: Normal vulva.     Vagina: Normal.     Cervix: Normal.     Comments: Short IUD strings noted Evidence of prior LEEP noted, well healed bed  Skin:    General: Skin is warm and dry.  Neurological:     General: No focal deficit present.     Mental Status: She is alert.  Psychiatric:        Mood and Affect: Mood normal.        Behavior: Behavior normal.        Thought Content: Thought content normal.        Judgment: Judgment normal.     Labs and Imaging No results found.      Assessment & Plan:  1. History of cervical dysplasia Repeat pap completed today per ASCCP guidelines, will follow up results. Declined STI testing during this visit.  - Cytology - PAP( [place])    Darliss Cheney, MD [facility] [place]

## 2022-08-08 LAB — CYTOLOGY - PAP: Diagnosis: NEGATIVE

## 2022-08-27 ENCOUNTER — Encounter: Payer: Self-pay | Admitting: General Practice

## 2022-09-08 ENCOUNTER — Encounter: Payer: Self-pay | Admitting: Family Medicine

## 2022-09-14 NOTE — Patient Instructions (Signed)
It was good to see you again today, happy holidays I recommend the latest COVID booster if not done already

## 2022-09-14 NOTE — Progress Notes (Unsigned)
[facility] [address], [address] [place], [place] [address] [phone]  Date:  09/17/2022   Name:  Lindsay Walker   DOB:  [birth year]   MRN:  [id number]  PCP:  Pearline Cables, MD    Chief Complaint: follow up on chest pain and skin irritation (Pt says this feels more like discomfort and not a pain, come and go. It has lessened since last OV. She does have some ShOB, more when lying down. Skin irritation above the clitoris: alternating A&D ointment and hydrocortisone. )   History of Present Illness:  Lindsay Walker is a 26 y.o. very pleasant female patient who presents with the following:  Patient seen today for follow-up- changed to virtual at the last minute Pt location is her car, in [place] My location is my office  Patient identity confirmed with 2 factors, she gives consent for virtual visit today.  The patient myself are present on the call today  Most recent visit with myself in August History of hyperthyroidism/Graves' disease, asthma, mild prediabetes Her asthma has been under good control recently- she has just albuterol prn which she does not generally use Dr. Erin Fulling has been helping her with abnormal Pap-her most recent Pap screening October 23rd-negative!   Recommend COVID booster HPV series Flu shot- recommended to her   Back in August she had concern of SOB for 2 weeks and CP for a year  D dimer negative, EKG and chest x-ray negative  She has felt fine today-this is not an acute issue, it is long-term She saw endocrinology for suppressed TSH- all was ok on recheck She is going to follow-up BW with them today.  If her blood work remains normal we will likely return to routine thyroid screening  We discussed her chronic chest pain and shortness of breath.  Patient admits she is allergic to animal dander, she works at an Psych[employer], counselling.  She also has pets at home.  She notes her symptoms do tend to  get worse when she gets home She is using albuterol- this may help sometimes Taking a few deep breaths may help  She is taking over-the-counter Zyrtec but nothing else at this time  She also mentions this concern of skin changes in the vulvar area.  I advised her I would need to evaluate this in person, she will come and see [place] when she can.  I also suggested she might establish with a doctor who is closer to her current home in [facility] Results  Component Value Date   TSH 0.07 (L) 05/21/2022    Lab Results  Component Value Date   HGBA1C 5.8 05/21/2022     Patient Active Problem List   Diagnosis Date Noted   Graves disease 07/01/2022   Mild intermittent asthma 07/01/2022   Polycystic ovary syndrome 06/03/2019   History of cervical dysplasia 05/31/2019   Hyperthyroidism 09/16/2015    Past Medical History:  Diagnosis Date   Asthma    Hyperthyroidism    Vaginal Pap smear, abnormal    HSIL CINii-III    Past Surgical History:  Procedure Laterality Date   TONSILECTOMY/ADENOIDECTOMY WITH MYRINGOTOMY      Social History   Tobacco Use   Smoking status: Never   Smokeless tobacco: Never  Substance Use Topics   Alcohol use: Yes    Alcohol/week: 0.0 standard drinks of alcohol   Drug use: No    Family History  Problem  Relation Age of Onset   Asthma Mother    Hypertension Mother    Thyroid disease Neg Hx     Allergies  Allergen Reactions   Penicillins     Hives Swelling     Medication list has been reviewed and updated.  Current Outpatient Medications on File Prior to Visit  Medication Sig Dispense Refill   albuterol (VENTOLIN HFA) 108 (90 Base) MCG/ACT inhaler Inhale 2 puffs into the lungs every 6 (six) hours as needed for wheezing or shortness of breath. 18 g 3   cetirizine (ZYRTEC) 10 MG tablet Take 1 tablet (10 mg total) by mouth daily. 90 tablet 3   levonorgestrel (MIRENA) 20 MCG/24HR IUD 1 each by Intrauterine route once.     No current  facility-administered medications on file prior to visit.    Review of Systems:  As per HPI- otherwise negative.   Physical Examination: There were no vitals filed for this visit. There were no vitals filed for this visit. There is no height or weight on file to calculate BMI. Ideal Body Weight:   Patient observed via MyChart video, she looks well.  No distress or shortness of breath is noted  She notes that she is not checking vital signs at home Assessment and Plan: Mild intermittent asthma, unspecified whether complicated - Plan: Ambulatory referral to Allergy  Allergic rhinitis due to animal hair and dander - Plan: predniSONE (DELTASONE) 20 MG tablet, Ambulatory referral to Allergy  Virtual visit today for concern of asthma and allergy symptoms.  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  Continue albuterol as needed She has an IUD, denies chance of pregnancy at the moment  She will keep you posted on her progress, will seek care right away if getting worse  Signed Abbe Amsterdam, MD

## 2022-09-17 ENCOUNTER — Telehealth (INDEPENDENT_AMBULATORY_CARE_PROVIDER_SITE_OTHER): Payer: No Typology Code available for payment source | Admitting: Family Medicine

## 2022-09-17 DIAGNOSIS — J3081 Allergic rhinitis due to animal (cat) (dog) hair and dander: Secondary | ICD-10-CM | POA: Diagnosis not present

## 2022-09-17 DIAGNOSIS — J4520 Mild intermittent asthma, uncomplicated: Secondary | ICD-10-CM

## 2022-09-17 MED ORDER — PREDNISONE 20 MG PO TABS: ORAL_TABLET | ORAL | 0 refills | Status: DC

## 2022-09-28 ENCOUNTER — Encounter: Payer: Self-pay | Admitting: Family Medicine

## 2022-09-28 MED ORDER — FLUTICASONE PROPIONATE HFA 110 MCG/ACT IN AERO: 2.0000 | INHALATION_SPRAY | Freq: Two times a day (BID) | RESPIRATORY_TRACT | 12 refills | Status: DC

## 2022-10-19 ENCOUNTER — Encounter: Payer: Self-pay | Admitting: Family Medicine

## 2022-10-23 MED ORDER — BECLOMETHASONE DIPROP HFA 80 MCG/ACT IN AERB: 1.0000 | INHALATION_SPRAY | Freq: Two times a day (BID) | RESPIRATORY_TRACT | 3 refills | Status: DC

## 2022-10-23 NOTE — Addendum Note (Signed)
Addended by: Lamar Blinks C on: 10/23/2022 05:43 AM   Modules accepted: Orders

## 2023-03-03 ENCOUNTER — Encounter: Payer: Self-pay | Admitting: Family Medicine

## 2023-03-04 ENCOUNTER — Telehealth: Payer: No Typology Code available for payment source | Admitting: Family Medicine

## 2023-03-04 DIAGNOSIS — F419 Anxiety disorder, unspecified: Secondary | ICD-10-CM

## 2023-03-04 NOTE — Progress Notes (Signed)
[address] [place], [place] [address] [phone]  Date:  03/04/2023   Name:  Lindsay Walker   DOB:  [birth year]   MRN:  [id number]  PCP:  Pearline Cables, MD    Chief Complaint: No chief complaint on file.   History of Present Illness:  Lindsay Walker is a 27 y.o. very pleasant female patient who presents with the following:  Connected with pt via mychart video  Pt location is her car in [facility], my location is office Patient identity from with 2 factors, the patient and myself are present on the call today.  She gives consent for virtual visit today  Pt notes she has been really anxious for the last 3-4 days Today is Wednesday On Sunday she watched a movie that upset her, and this seemed to be the start of her sx On Monday at bedtime she was shaking all over, had trouble sleeping, diarrhea, feeling foggy Symptoms persisted until yesterday which made her concerned  She went to UC yesterday in [facility] and was evaluated for any medical issue-reviewed urgent care note, all seem to be okay from a medical standpoint: Viral syndrome/diarrhea. Patient is stable on evaluation with appropriate vital signs and is nontoxic in appearance. No rebound or guarding on abdominal exam. No obvious tremors on exam. Discussed differentials with patient to include viral syndrome, thyroid storm, colitis, diverticulitis, appendicitis. Again no rebound or guarding on exam. She is afebrile and stable on evaluation with appropriate vital signs. Recommended watchful waiting, plenty of fluids, bland diet and as needed Imodium. Reviewed red flag precautions that warrant further evaluation and she will follow-up with her PCP if not continuing to improve  She had her thyroid levels checked yesterday-all ok  She continues to take methimazole 5 mg every other day  She also connected with I believe a psychologist or other mental health care professional  yesterday.  They had her start on hydroxyzine and recommended follow-up with myself/PCP  Today she is feeling a bit better but just wanted to check in  She has not needed to use hydroxyzine  Patient notes that prior to the last few days she felt just fine, she denies any longer-term depression or anxiety symptoms  Patient Active Problem List   Diagnosis Date Noted   Graves disease 07/01/2022   Mild intermittent asthma 07/01/2022   Polycystic ovary syndrome 06/03/2019   History of cervical dysplasia 05/31/2019   Hyperthyroidism 09/16/2015    Past Medical History:  Diagnosis Date   Asthma    Hyperthyroidism    Vaginal Pap smear, abnormal    HSIL CINii-III    Past Surgical History:  Procedure Laterality Date   TONSILECTOMY/ADENOIDECTOMY WITH MYRINGOTOMY      Social History   Tobacco Use   Smoking status: Never   Smokeless tobacco: Never  Substance Use Topics   Alcohol use: Yes    Alcohol/week: 0.0 standard drinks of alcohol   Drug use: No    Family History  Problem Relation Age of Onset   Asthma Mother    Hypertension Mother    Thyroid disease Neg Hx     Allergies  Allergen Reactions   Penicillins     Hives Swelling     Medication list has been reviewed and updated.  Current Outpatient Medications on File Prior to Visit  Medication Sig Dispense Refill   albuterol (VENTOLIN HFA) 108 (90 Base) MCG/ACT inhaler Inhale 2 puffs into the lungs  every 6 (six) hours as needed for wheezing or shortness of breath. 18 g 3   beclomethasone (QVAR) 80 MCG/ACT inhaler Inhale 1 puff into the lungs 2 (two) times daily. 1 each 3   cetirizine (ZYRTEC) 10 MG tablet Take 1 tablet (10 mg total) by mouth daily. 90 tablet 3   levonorgestrel (MIRENA) 20 MCG/24HR IUD 1 each by Intrauterine route once.     predniSONE (DELTASONE) 20 MG tablet Take 40 mg po daily for 3 days, then 20 mg daily for 3 days 9 tablet 0   No current facility-administered medications on file prior to visit.     Review of Systems:  As per HPI- otherwise negative.   Physical Examination: There were no vitals filed for this visit. There were no vitals filed for this visit. There is no height or weight on file to calculate BMI. Ideal Body Weight:    Patient observed via MyChart video.  She looks well, her normal self.  No shortness of breath or distress is noted  Assessment and Plan: Acute anxiety  Patient seen today for anxiety follow-up.  As described above, she was seen by a medical urgent care provider and also by I believe her mental health care provider yesterday and she is stable.  She has hydroxyzine to use but has not yet needed.  We discussed other possible treatment options. For the time being she feels that hydroxyzine as needed is adequate for her.  She will let me know if I can do a thing else to help her.  Patient is actually moved to [facility] as of the last couple of years, we discussed having her establish with a more local PCP which is a good idea  Signed Abbe Amsterdam, MD

## 2023-03-04 NOTE — Telephone Encounter (Signed)
The next available appointment with you is 5/29. I have held the 2:20 PM spot since our 3 PM is booked if that date is too far out. Pt was seen by [facility].

## 2023-09-13 NOTE — Progress Notes (Unsigned)
[facility] [address], [address] [place], [place] [address] [phone]  Date:  09/16/2023   Name:  Lindsay Walker   DOB:  [birth year]   MRN:  [id number]  PCP:  Lindsay Cables, MD    Chief Complaint: follow up from Endo (Sees Lindsay Jens Som, MD- 08/12/2023/Flu: declines/Hep C screen due)   History of Present Illness:  Lindsay Walker is a 27 y.o. very pleasant female patient who presents with the following:  Patient seen today for follow-up.  She had some blood work done  endocrinology that she wants to discuss.  Also concern of chest pain  Most recent visit with myself was a virtual visit in May She was seen by her endocrinologist with Lindsay in early October: Assessment 1. Hyperthyroidism (Primary) - Free T4; Future - T3, Free; Future - TSH; Future 2. Graves disease 3. Thyromegaly 4. Medication monitoring encounter - Comprehensive Metabolic Panel; Future - CBC And Differential; Future Plan 1. Continue methimazole 5 mg every other day. 2. Propranolol has been discontinued.  3. Thyroid function test will be checked. The methimazole dose will be adjusted post lab review if indicated. 4. The small thyroid nodule noted on ultrasound will be monitored. 5. Follow-up in 4 months with nurse practitioner.   Her endocrinologist noted leukopenia, specifically neutropenia which was consistent on recheck.  She would like this to follow-up on this today She is not using methimazole any longer as per her endocrinologist recommendation -she notes her thyroid levels have normalized She is otherwise feeling well - no fevers or chills, no weight loss  She has noted CP for about 6 weeks  She has not noted any particular pattern, not exertional.  Chest pain episodes will last perhaps a minute May occur 2-3x a day on a consistent basis She has not noted any relationship to eating No SOB She works out with good intensity  on a regular basis and notes her endurance seems to be normal-she can do her workouts with no problem She may get palpitations at times, but this is not unsual with her thyroid concerns  Pain is not severe but enough to [place]  She did have some chest pain in the past - see my note dated 05/21/22 We did a sed rate and D Dimer at that time which were both negative   Her GM did have MI in her 41s -wise no family of heart disease that she is aware of  Pt has never been a smoker  She has not tried any particular medication for chest pain so far   She is not having any particular asthma sx right now  Most recent episode of chest pain was yesterday  Her endocrinologist did labs which demonstrate neutropenia-  Looking back we have seen milder leukocytosis in the past, and she did have a pathology smear back in 2016 at [facility]   Most recent CBC with diff follows, she had leukopenia/ neutropenia 10/3 and 11/21 as well  09/14/23 WBC 3.4 - 10.8 x10E3/uL 2.9 Low    RBC 3.77 - 5.28 x10E6/uL 4.54   Hemoglobin 11.1 - 15.9 g/dL 25.9   Hematocrit 56.3 - 46.6 % 39.0   MCV 79 - 97 fL 86   MCH 26.6 - 33.0 pg 26.2 Low    MCHC 31.5 - 35.7 g/dL 87.5 Low    RDW 64.3 - 15.4 % 12.7   Platelet Count 150 - 450 x10E3/uL 164  Neutrophils Not Estab. % 36   Lymphs Relative Not Estab. % 49   Monocytes Not Estab. % 11   Eos Relative Not Estab. % 3   Basos Relative Not Estab. % 1   Neutrophils Absolute 1.4 - 7.0 x10E3/uL 1.0 Low    Lymphocytes Absolute 0.7 - 3.1 x10E3/uL 1.4   Monocytes Absolute 0.1 - 0.9 x10E3/uL 0.3   Eosinophils Absolute 0.0 - 0.4 x10E3/uL 0.1   Basophils Absolute 0.0 - 0.2 x10E3/uL 0.0   Immature Granulocytes Not Estab. % 0   Immature Grans (Abs) 0.0 - 0.1 x10E3/uL 0.0    Patient Active Problem List   Diagnosis Date Noted   Graves disease 07/01/2022   Mild intermittent asthma 07/01/2022   Polycystic ovary syndrome 06/03/2019   History of cervical  dysplasia 05/31/2019   Hyperthyroidism 09/16/2015    Past Medical History:  Diagnosis Date   Asthma    Hyperthyroidism    Vaginal Pap smear, abnormal    HSIL CINii-III    Past Surgical History:  Procedure Laterality Date   TONSILECTOMY/ADENOIDECTOMY WITH MYRINGOTOMY      Social History   Tobacco Use   Smoking status: Never   Smokeless tobacco: Never  Substance Use Topics   Alcohol use: Yes    Alcohol/week: 0.0 standard drinks of alcohol   Drug use: No    Family History  Problem Relation Age of Onset   Asthma Mother    Hypertension Mother    Thyroid disease Neg Hx     Allergies  Allergen Reactions   Penicillins     Hives Swelling     Medication list has been reviewed and updated.  Current Outpatient Medications on File Prior to Visit  Medication Sig Dispense Refill   albuterol (VENTOLIN HFA) 108 (90 Base) MCG/ACT inhaler Inhale 2 puffs into the lungs every 6 (six) hours as needed for wheezing or shortness of breath. 18 g 3   cetirizine (ZYRTEC) 10 MG tablet Take 1 tablet (10 mg total) by mouth daily. 90 tablet 3   levonorgestrel (MIRENA) 20 MCG/24HR IUD 1 each by Intrauterine route once.     beclomethasone (QVAR) 80 MCG/ACT inhaler Inhale 1 puff into the lungs 2 (two) times daily. (Patient not taking: Reported on 09/16/2023) 1 each 3   methimazole (TAPAZOLE) 5 MG tablet SMARTSIG:1.0 Tablet(s) By Mouth Daily (Patient not taking: Reported on 09/16/2023)     No current facility-administered medications on file prior to visit.    Review of Systems:  As per HPI- otherwise negative.   Physical Examination: Vitals:   09/16/23 1038  BP: 100/60  Pulse: 87  Resp: 18  Temp: 97.8 F (36.6 C)  SpO2: 97%   Vitals:   09/16/23 1038  Weight: 108 lb 3.2 oz (49.1 kg)  Height: 5\' 3"  (1.6 m)   Body mass index is 19.17 kg/m. Ideal Body Weight: Weight in (lb) to have BMI = 25: 140.8  GEN: no acute distress.  Slender build, looks well HEENT: Atraumatic,  Normocephalic.  Bilateral TM wnl, oropharynx normal.  PEERL,EOMI.   Ears and Nose: No external deformity. CV: RRR, No M/G/R. No JVD. No thrill. No extra heart sounds.  I am not able to reproduce chest pain by pressing on the chest wall PULM: CTA B, no wheezes, crackles, rhonchi. No retractions. No resp. distress. No accessory muscle use. ABD: S, NT, ND, +BS. No rebound. No HSM. EXTR: No c/c/e PSYCH: Normally interactive. Conversant.   EKG:  SR with some abnormality of pre-cordial QRS  and t waves.  Spoke with DOD with cardiology who felt no acute abnormality  Assessment and Plan: Chest pain, unspecified type - Plan: EKG 12-Lead, DG Chest 2 View, pantoprazole (PROTONIX) 40 MG tablet, Ambulatory referral to Cardiology  Neutropenia, unspecified type (HCC) - Plan: Ambulatory referral to Hematology / Oncology  Patient seen today with a couple concerns.  We note neutropenia which has been persistent over a few blood draws.  We suspect this is physiologic, but certainly would like to have her see hematology for consultation.  I placed referral today  Discussed atypical chest pain.  I will have her try pantoprazole for 2 to 4 weeks in case this may be reflux related.  Obtain chest x-ray today.  EKG computer read is abnormal but I have discussed and cleared her EKG with cardiology.  We did decide to have her see cardiology in person for an evaluation  She will seek care if anything changes or gets worse in the meantime  Signed Abbe Amsterdam, MD  Received her chest x-ray as below, message to patient  DG Chest 2 View  Result Date: 09/16/2023 CLINICAL DATA:  Chest pain EXAM: CHEST - 2 VIEW COMPARISON:  05/21/2022 FINDINGS: The heart size and mediastinal contours are within normal limits. Both lungs are clear. The visualized skeletal structures are unremarkable. IMPRESSION: No active cardiopulmonary disease. Electronically Signed   By: Judie Petit.  Shick M.D.   On: 09/16/2023 12:23

## 2023-09-16 ENCOUNTER — Ambulatory Visit (HOSPITAL_BASED_OUTPATIENT_CLINIC_OR_DEPARTMENT_OTHER)
Admission: RE | Admit: 2023-09-16 | Discharge: 2023-09-16 | Disposition: A | Discharge status: Home / Self Care | Payer: No Typology Code available for payment source | Source: Ambulatory Visit | Attending: Family Medicine | Admitting: Family Medicine

## 2023-09-16 ENCOUNTER — Ambulatory Visit: Payer: No Typology Code available for payment source | Admitting: Family Medicine

## 2023-09-16 ENCOUNTER — Other Ambulatory Visit: Payer: Self-pay | Admitting: Family Medicine

## 2023-09-16 ENCOUNTER — Encounter: Payer: Self-pay | Admitting: Family Medicine

## 2023-09-16 VITALS — BP 100/60 | HR 87 | Temp 97.8°F | Resp 18 | Ht 63.0 in | Wt 108.2 lb

## 2023-09-16 DIAGNOSIS — D709 Neutropenia, unspecified: Secondary | ICD-10-CM | POA: Diagnosis not present

## 2023-09-16 DIAGNOSIS — R079 Chest pain, unspecified: Secondary | ICD-10-CM | POA: Insufficient documentation

## 2023-09-16 MED ORDER — PANTOPRAZOLE SODIUM 40 MG PO TBEC: 40.0000 mg | DELAYED_RELEASE_TABLET | Freq: Every day | ORAL | 1 refills | Status: DC

## 2023-09-16 NOTE — Patient Instructions (Addendum)
It was good to see you today- please let me know if anything is changing or getting worse! We will have you seen by hematology and I will also set up a cardiology evaluation for you  Try the protonix daily for 2-4 weeks- let me now how this works for you

## 2023-09-24 ENCOUNTER — Encounter: Payer: Self-pay | Admitting: Hematology & Oncology

## 2023-09-24 ENCOUNTER — Telehealth: Payer: Self-pay | Admitting: Family Medicine

## 2023-09-24 NOTE — Telephone Encounter (Signed)
Referral has been closed

## 2023-09-24 NOTE — Telephone Encounter (Signed)
[facility] just wanted to advise they have made multiple attempts to get in contact with the pt and will be closing the referral.

## 2023-11-04 ENCOUNTER — Encounter: Payer: Self-pay | Admitting: Obstetrics & Gynecology

## 2023-11-04 ENCOUNTER — Other Ambulatory Visit (HOSPITAL_COMMUNITY)
Admission: RE | Admit: 2023-11-04 | Discharge: 2023-11-04 | Disposition: A | Discharge status: Home / Self Care | Payer: No Typology Code available for payment source | Source: Ambulatory Visit | Attending: Obstetrics & Gynecology | Admitting: Obstetrics & Gynecology

## 2023-11-04 ENCOUNTER — Ambulatory Visit (INDEPENDENT_AMBULATORY_CARE_PROVIDER_SITE_OTHER): Payer: No Typology Code available for payment source | Admitting: Obstetrics & Gynecology

## 2023-11-04 VITALS — BP 104/61 | HR 63 | Ht 63.0 in | Wt 109.0 lb

## 2023-11-04 DIAGNOSIS — Z01419 Encounter for gynecological examination (general) (routine) without abnormal findings: Secondary | ICD-10-CM | POA: Insufficient documentation

## 2023-11-04 DIAGNOSIS — Z1151 Encounter for screening for human papillomavirus (HPV): Secondary | ICD-10-CM

## 2023-11-04 DIAGNOSIS — Z8742 Personal history of other diseases of the female genital tract: Secondary | ICD-10-CM | POA: Diagnosis present

## 2023-11-04 NOTE — Progress Notes (Signed)
Subjective:     Lindsay Walker is a 28 y.o. female here for a routine exam.  Current complaints: no GYN complaints. Married. Lives in [place]. Comes here for GYN care. Her mother also lives in the area.   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 Has to take O chem. Planning to take this summer.    Gynecologic History No LMP recorded. (Menstrual status: IUD). Contraception: IUD Last Pap: 08/04/22. Results were: normal 05/24/2022: ADEQUACY: Satisfactory for evaluation  endocervical/transformation zone component PRESENT. Abnormal  DIAGNOSIS: LOW GRADE SQUAMOUS INTRAEPITHELIAL LESION: CIN-1/ HPV (LSIL). Abnormal  CHLAMYDIA ([place]): Negative NEISSERIA GONORRHEA ([place]): Negative MATERIAL SUBMITTED: CervicoVaginal Pap [ThinPrep Imaged] Abnormal  Last mammogram: n/a.   Obstetric History OB History  Gravida Para Term Preterm AB Living  0 0 0 0 0 0  SAB IAB Ectopic Multiple Live Births  0 0 0 0 0     The following portions of the patient's history were reviewed and updated as appropriate: allergies, current medications, past family history, past medical history, past social history, past surgical history, and problem list.  Review of Systems Pertinent items are noted in HPI.    Objective:  BP 104/61 (BP Location: Left Arm, Patient Position: Sitting, Cuff Size: Normal)   Pulse 63   Ht 5\' 3"  (1.6 m)   Wt 109 lb (49.4 kg)   LMP 10/05/2023 (Approximate)   BMI 19.31 kg/m   General Appearance:    Alert, cooperative, no distress, appears stated age  Head:    Normocephalic, without obvious abnormality, atraumatic  Eyes:    conjunctiva/corneas clear, EOM's intact, both eyes  Ears:    Normal external ear canals, both ears  Nose:   Nares normal, septum midline, mucosa normal, no drainage    or sinus tenderness  Throat:   Lips, mucosa, and tongue normal; teeth and gums normal  Neck:   Supple, symmetrical, trachea midline, no adenopathy;    thyroid:  no enlargement/tenderness/nodules   Back:     Symmetric, no curvature, ROM normal, no CVA tenderness  Lungs:     respirations unlabored  Chest Wall:    No tenderness or deformity   Heart:    Regular rate and rhythm  Breast Exam:    No tenderness, masses, or nipple abnormality  Abdomen:     Soft, non-tender, bowel sounds active all four quadrants,    no masses, no organomegaly  Genitalia:    Normal female without lesion, discharge or tenderness   IUD strings not visualized   Extremities:   Extremities normal, atraumatic, no cyanosis or edema  Pulses:   2+ and symmetric all extremities  Skin:   Skin color, texture, turgor normal, no rashes or lesions     Assessment:    Healthy female exam.  Contraception/conception counseling- pt undecided on when she wants to conceive. We discussed timing of IUD removal She will discuss with spouse and decide. We will schedule her as a hysteroscopic removal at that time so that she does not need to travel back if we are not able to remove it manually.     Plan:  Lindsay Walker was seen today for gynecologic exam.  Diagnoses and all orders for this visit:  Well female exam with routine gynecological exam -     Cytology - PAP  History of abnormal cervical Pap smear -     Cytology - PAP   F/u in 1 year or sooner prn   Lindsay Walker, M.D., [facility]

## 2023-11-06 ENCOUNTER — Inpatient Hospital Stay: Payer: No Typology Code available for payment source

## 2023-11-06 ENCOUNTER — Inpatient Hospital Stay: Payer: No Typology Code available for payment source | Admitting: Hematology & Oncology

## 2023-11-07 ENCOUNTER — Encounter: Payer: Self-pay | Admitting: Family Medicine

## 2023-11-10 LAB — CYTOLOGY - PAP
Chlamydia: NEGATIVE
Comment: NEGATIVE
Comment: NORMAL
Diagnosis: NEGATIVE
Neisseria Gonorrhea: NEGATIVE

## 2023-11-11 ENCOUNTER — Encounter: Payer: Self-pay | Admitting: Obstetrics & Gynecology

## 2023-11-20 ENCOUNTER — Other Ambulatory Visit: Payer: Self-pay | Admitting: Family

## 2023-11-20 ENCOUNTER — Inpatient Hospital Stay (HOSPITAL_BASED_OUTPATIENT_CLINIC_OR_DEPARTMENT_OTHER): Payer: No Typology Code available for payment source | Admitting: Family

## 2023-11-20 ENCOUNTER — Inpatient Hospital Stay: Payer: No Typology Code available for payment source | Attending: Hematology & Oncology

## 2023-11-20 ENCOUNTER — Other Ambulatory Visit: Payer: Self-pay

## 2023-11-20 VITALS — BP 119/94 | HR 78 | Temp 98.0°F | Resp 17 | Ht 63.0 in | Wt 109.4 lb

## 2023-11-20 DIAGNOSIS — D72819 Decreased white blood cell count, unspecified: Secondary | ICD-10-CM

## 2023-11-20 LAB — CMP (CANCER CENTER ONLY)
ALT: 9 U/L (ref 0–44)
AST: 11 U/L — ABNORMAL LOW (ref 15–41)
Albumin: 4.1 g/dL (ref 3.5–5.0)
Alkaline Phosphatase: 33 U/L — ABNORMAL LOW (ref 38–126)
Anion gap: 6 (ref 5–15)
BUN: 11 mg/dL (ref 6–20)
CO2: 28 mmol/L (ref 22–32)
Calcium: 9.5 mg/dL (ref 8.9–10.3)
Chloride: 104 mmol/L (ref 98–111)
Creatinine: 0.68 mg/dL (ref 0.44–1.00)
GFR, Estimated: >60 mL/min (ref >60)
Glucose, Bld: 79 mg/dL (ref 70–99)
Potassium: 4.1 mmol/L (ref 3.5–5.1)
Sodium: 138 mmol/L (ref 135–145)
Total Bilirubin: 0.5 mg/dL (ref 0.0–1.2)
Total Protein: 7.3 g/dL (ref 6.5–8.1)

## 2023-11-20 LAB — LACTATE DEHYDROGENASE: LDH: 94 U/L — ABNORMAL LOW (ref 98–192)

## 2023-11-20 LAB — CBC WITH DIFFERENTIAL (CANCER CENTER ONLY)
Abs Immature Granulocytes: 0 10*3/uL (ref 0.00–0.07)
Basophils Absolute: 0 10*3/uL (ref 0.0–0.1)
Basophils Relative: 0 %
Eosinophils Absolute: 0.1 10*3/uL (ref 0.0–0.5)
Eosinophils Relative: 2 %
HCT: 35.3 % — ABNORMAL LOW (ref 36.0–46.0)
Hemoglobin: 11 g/dL — ABNORMAL LOW (ref 12.0–15.0)
Immature Granulocytes: 0 %
Lymphocytes Relative: 58 %
Lymphs Abs: 1.6 10*3/uL (ref 0.7–4.0)
MCH: 26.2 pg (ref 26.0–34.0)
MCHC: 31.2 g/dL (ref 30.0–36.0)
MCV: 84 fL (ref 80.0–100.0)
Monocytes Absolute: 0.3 10*3/uL (ref 0.1–1.0)
Monocytes Relative: 12 %
Neutro Abs: 0.8 10*3/uL — ABNORMAL LOW (ref 1.7–7.7)
Neutrophils Relative %: 28 %
Platelet Count: 154 10*3/uL (ref 150–400)
RBC: 4.2 MIL/uL (ref 3.87–5.11)
RDW: 13.6 % (ref 11.5–15.5)
WBC Count: 2.8 10*3/uL — ABNORMAL LOW (ref 4.0–10.5)
nRBC: 0 % (ref 0.0–0.2)

## 2023-11-20 LAB — SAVE SMEAR(SSMR), FOR PROVIDER SLIDE REVIEW

## 2023-11-20 NOTE — Progress Notes (Signed)
 Hematology/Oncology Consultation   Name: Lindsay Walker      MRN: [id number]    Location: Room/bed info not found  Date: 11/20/2023 Time:11:00 AM   REFERRING PHYSICIAN:  Harlene Schroeder, MD  REASON FOR CONSULT:  Neutropenia    DIAGNOSIS: Benign hereditary leukopenia   HISTORY OF PRESENT ILLNESS:  Lindsay Walker is a very pleasant 28 yo African American female with history of mild intermittent decrease in WBC count for at least the last 9 years.  She denies any issues with frequent or recurrent infections.  No lymphadenopathy or organomegaly on exam.  No fever, chills, n/v, cough, rash, dizziness, SOB, chest pain, palpitations, abdominal pain or changes in bowel or bladder habits at this time.  No known familial history of low WBC count.  No sickle cell disease or trait.  No known liver or spleen issues.  She has an IUD in place and no cycle.  No history of pregnancy or miscarriage.  No history of diabetes.  She has hypothyroidism and is currently taking methimazole . She has rare palpitations.  No personal history of cancer. Maternal grandfather had prostate cancer.  No swelling, tenderness, numbness or tingling in her extremities.  No falls or syncope.  Appetite and hydration are good. Weight is 109 lbs.  No smoking, ETOH or recreational drug use.  She had her tonsils out as well as a knee arthroscopy in the past without any complications with healing.  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   ROS: All other 10 point review of systems is negative.   PAST MEDICAL HISTORY:   Past Medical History:  Diagnosis Date   Asthma    Hyperthyroidism    Vaginal Pap smear, abnormal    HSIL CINii-III    ALLERGIES: Allergies  Allergen Reactions   Penicillins     Hives Swelling       MEDICATIONS:  Current Outpatient Medications on File Prior to Visit  Medication Sig Dispense Refill   albuterol  (VENTOLIN  HFA) 108 (90 Base) MCG/ACT inhaler Inhale 2 puffs into the lungs  every 6 (six) hours as needed for wheezing or shortness of breath. 18 g 3   beclomethasone (QVAR) 80 MCG/ACT inhaler Inhale 1 puff into the lungs 2 (two) times daily. (Patient not taking: Reported on 09/16/2023) 1 each 3   cetirizine  (ZYRTEC ) 10 MG tablet Take 1 tablet (10 mg total) by mouth daily. 90 tablet 3   levonorgestrel  (MIRENA ) 20 MCG/24HR IUD 1 each by Intrauterine route once.     methimazole  (TAPAZOLE ) 5 MG tablet SMARTSIG:1.0 Tablet(s) By Mouth Daily (Patient not taking: Reported on 09/16/2023)     pantoprazole  (PROTONIX ) 40 MG tablet Take 1 tablet (40 mg total) by mouth daily. (Patient not taking: Reported on 11/04/2023) 30 tablet 1   No current facility-administered medications on file prior to visit.     PAST SURGICAL HISTORY Past Surgical History:  Procedure Laterality Date   TONSILECTOMY/ADENOIDECTOMY WITH MYRINGOTOMY      FAMILY HISTORY: Family History  Problem Relation Age of Onset   Asthma Mother    Hypertension Mother    Thyroid  disease Neg Hx     SOCIAL HISTORY:  reports that she has never smoked. She has never used smokeless tobacco. She reports current alcohol use. She reports that she does not use drugs.  PERFORMANCE STATUS: The patient's performance status is 0 - Asymptomatic  PHYSICAL EXAM: Most Recent Vital Signs: Last menstrual period 10/05/2023. BP (!) 119/94 (BP Location: Left Arm,  Patient Position: Sitting)   Pulse 78   Temp 98 F (36.7 C) (Oral)   Resp 17   Ht 5' 3 (1.6 m)   Wt 109 lb 6.4 oz (49.6 kg)   LMP 10/05/2023 (Approximate)   SpO2 100%   BMI 19.38 kg/m   General Appearance:    Alert, cooperative, no distress, appears stated age  Head:    Normocephalic, without obvious abnormality, atraumatic  Eyes:    PERRL, conjunctiva/corneas clear, EOM's intact, fundi    benign, both eyes        Throat:   Lips, mucosa, and tongue normal; teeth and gums normal  Neck:   Supple, symmetrical, trachea midline, no adenopathy;    thyroid :  no  enlargement/tenderness/nodules; no carotid   bruit or JVD  Back:     Symmetric, no curvature, ROM normal, no CVA tenderness  Lungs:     Clear to auscultation bilaterally, respirations unlabored  Chest Wall:    No tenderness or deformity   Heart:    Regular rate and rhythm, S1 and S2 normal, no murmur, rub   or gallop     Abdomen:     Soft, non-tender, bowel sounds active all four quadrants,    no masses, no organomegaly        Extremities:   Extremities normal, atraumatic, no cyanosis or edema  Pulses:   2+ and symmetric all extremities  Skin:   Skin color, texture, turgor normal, no rashes or lesions  Lymph nodes:   Cervical, supraclavicular, and axillary nodes normal  Neurologic:   CNII-XII intact, normal strength, sensation and reflexes    throughout    LABORATORY DATA:  Results for orders placed or performed in visit on 11/20/23 (from the past 48 hours)  Save Smear for Provider Slide Review     Status: None   Collection Time: 11/20/23 10:45 AM  Result Value Ref Range   Smear Review SMEAR STAINED AND AVAILABLE FOR REVIEW     Comment: Performed at [facility] at [facility], [address] [place], [place] [address]  CBC with Differential (Cancer Center Only)     Status: Abnormal   Collection Time: 11/20/23 10:45 AM  Result Value Ref Range   WBC Count 2.8 (L) 4.0 - 10.5 K/uL   RBC 4.20 3.87 - 5.11 MIL/uL   Hemoglobin 11.0 (L) 12.0 - 15.0 g/dL   HCT 64.6 (L) 63.9 - 53.9 %   MCV 84.0 80.0 - 100.0 fL   MCH 26.2 26.0 - 34.0 pg   MCHC 31.2 30.0 - 36.0 g/dL   RDW 86.3 88.4 - 84.4 %   Platelet Count 154 150 - 400 K/uL   nRBC 0.0 0.0 - 0.2 %   Neutrophils Relative % 28 %   Neutro Abs 0.8 (L) 1.7 - 7.7 K/uL   Lymphocytes Relative 58 %   Lymphs Abs 1.6 0.7 - 4.0 K/uL   Monocytes Relative 12 %   Monocytes Absolute 0.3 0.1 - 1.0 K/uL   Eosinophils Relative 2 %   Eosinophils Absolute 0.1 0.0 - 0.5 K/uL   Basophils Relative 0 %   Basophils Absolute 0.0  0.0 - 0.1 K/uL   Immature Granulocytes 0 %   Abs Immature Granulocytes 0.00 0.00 - 0.07 K/uL    Comment: Performed at [facility] at [facility], [address], [place], [place] [address]      RADIOGRAPHY: No results found.  PATHOLOGY: None  ASSESSMENT/PLAN: Ms. Bergeson is a very pleasant 28 yo African American female with history of mild intermittent decrease in WBC count for at least the last 9 years.  Her counts are stable at this time and she remains asymptomatic.  CBC with diff and blood smear reveiwed with Dr. Timmy. No abnormality or evidence of malignancy noted. All cells (red, white and platelets) appear well developed.  This is felt to be benign hereditary leukopenia.  No intervention or follow-up needed.   All questions were answered. The patient knows to call the clinic with any problems, questions or concerns. We can certainly see her again for any future heme/onc issue that may arise.   The patient was discussed with Dr. Timmy and he is in agreement with the aforementioned.   Lauraine Pepper, NP

## 2024-01-20 DIAGNOSIS — J45909 Unspecified asthma, uncomplicated: Secondary | ICD-10-CM | POA: Insufficient documentation

## 2024-01-20 DIAGNOSIS — R87629 Unspecified abnormal cytological findings in specimens from vagina: Secondary | ICD-10-CM | POA: Insufficient documentation

## 2024-01-21 ENCOUNTER — Ambulatory Visit

## 2024-01-21 VITALS — BP 110/70 | HR 81 | Ht 63.0 in | Wt 110.6 lb

## 2024-01-21 DIAGNOSIS — R0789 Other chest pain: Secondary | ICD-10-CM | POA: Diagnosis not present

## 2024-01-21 DIAGNOSIS — R87629 Unspecified abnormal cytological findings in specimens from vagina: Secondary | ICD-10-CM

## 2024-01-21 NOTE — Assessment & Plan Note (Signed)
 Appears not back in description. Good functional status at baseline with activities, exercise and at work.  No reproducible symptoms. EKG changes nonspecific likely normal variant given her thin built body habitus.  At this time we will hold off on any further cardiac testing. She is aware if there is any significant increase in symptoms or concern for underlying cardiac etiology to notify us and we can consider further functional testing with a stress test.

## 2024-01-21 NOTE — Patient Instructions (Signed)
 Medication Instructions:  Your physician recommends that you continue on your current medications as directed. Please refer to the Current Medication list given to you today.  *If you need a refill on your cardiac medications before your next appointment, please call your pharmacy*   Lab Work: None Ordered If you have labs (blood work) drawn today and your tests are completely normal, you will receive your results only by: MyChart Message (if you have MyChart) OR A paper copy in the mail If you have any lab test that is abnormal or we need to change your treatment, we will call you to review the results.   Testing/Procedures: None Ordered   Follow-Up: At [facility], you and your health needs are our priority.  As part of our continuing mission to provide you with exceptional heart care, we have created designated Provider Care Teams.  These Care Teams include your primary Cardiologist (physician) and Advanced Practice Providers (APPs -  Physician Assistants and Nurse Practitioners) who all work together to provide you with the care you need, when you need it.  We recommend signing up for the patient portal called "MyChart".  Sign up information is provided on this After Visit Summary.  MyChart is used to connect with patients for Virtual Visits (Telemedicine).  Patients are able to view lab/test results, encounter notes, upcoming appointments, etc.  Non-urgent messages can be sent to your provider as well.   To learn more about what you can do with MyChart, go to [email or web address].

## 2024-01-21 NOTE — Progress Notes (Signed)
 Cardiology Consultation:    Date:  01/21/2024   ID:  Lindsay Walker, DOB Mar 29, 1996, MRN [id number]  PCP:  Pearline Cables, MD  Cardiologist:  Marlyn Corporal Shant Hence, MD   Referring MD: Pearline Cables, MD   No chief complaint on file.    ASSESSMENT AND PLAN:   Lindsay Walker 28 year old woman with no significant prior cardiac history.  Has history of hyperthyroidism was on methimazole which is currently on hold for the past couple months due to mild leukopenia.  Also has history of asthma and uses albuterol as needed.  Came in for further evaluation of atypical chest pain for the past year which appears noncardiac in description.  Problem List Items Addressed This Visit     Chest pain, non-cardiac - Primary   Appears not back in description. Good functional status at baseline with activities, exercise and at work.  No reproducible symptoms. EKG changes nonspecific likely normal variant given her thin built body habitus.  At this time we will hold off on any further cardiac testing. She is aware if there is any significant increase in symptoms or concern for underlying cardiac etiology to notify us and we can consider further functional testing with a stress test.      Relevant Orders   EKG 12-Lead (Completed)      History of Present Illness:    Lindsay Walker is a 28 y.o. female who is being seen today for the evaluation of chest pain at the request of Copland, Gwenlyn Found, MD.  Pleasant woman here for the visit by herself.  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  Has a history of asthma, hyperthyroidism [on methimazole until recently and now on hold for the past couple months], mild leukopenia [follows up with oncologist]  Here for further evaluation of chest pain.  She describes this as episodes of chest discomfort occurring for over the past year.  Typically occurs in very focal area under the left breast.  Episodes she describes as a sharp sensation that can last for a day or up  to a week.  And then she is remains asymptomatic for the rest of the..  No significant impediment to her day-to-day activities as full-time Insurance risk surveyor.  Picks up heavy pets.  Does exercise regularly, cardio and yoga.  Denies any significant functional changes. Describes brief episodes of palpitations that last for seconds, infrequent, without any significant symptoms and she is not bothered by these.  She the smart watch and able to monitor if she has any symptoms and thus far she has not had any significant episodes that she was able to capture.  Does not smoke. Rare social alcohol consumption No illicit drug use. No significant family history other than grandmother in her late 77s recently diagnosed with MI.  Denies any significant family history of sickle cell trait and she herself has been tested for blood disorders prior to being identified with hyperthyroidism.  EKG from PCPs office visit 09-16-2023 noted sinus rhythm with PR interval 168 ms, QRS duration 88 ms, nonspecific T wave inversion in lead V3.  Blood work recently from 11-20-2023 with hemoglobin 11, hematocrit 35.3, WBC 2.8, platelets 154 Sodium 138, potassium 4.1, BUN 11, creatinine 0.68 AST and alkaline phosphatase levels were noted to be low.  ALT normal.  EKG in the clinic today shows sinus rhythm heart rate 81/min, PR interval normal 150 ms, anteroseptal T wave inversions similar to prior EKG.  Past Medical History:  Diagnosis Date   Asthma  Cyst of ovary 08/17/2019   IUD strings not seen but [place] w/IUD in proper place. 08/17/2019: 2.6cm simple cyst     Graves disease 07/01/2022   History of cervical dysplasia 05/31/2019   05/2019 LSIL HPV + PCP  03/2020 LSIL, HRHPV +  04/2020 bx CIN II-III  07/2020 CIN II-III, pos margins  12/2020 NILM, HPV -     Hyperthyroidism    Mild intermittent asthma 07/01/2022   Polycystic ovary syndrome 06/03/2019   DHEAS elevated.     Vaginal Pap smear, abnormal    HSIL CINii-III    Past  Surgical History:  Procedure Laterality Date   TONSILECTOMY/ADENOIDECTOMY WITH MYRINGOTOMY      Current Medications: Current Meds  Medication Sig   albuterol (VENTOLIN HFA) 108 (90 Base) MCG/ACT inhaler Inhale 2 puffs into the lungs every 6 (six) hours as needed for wheezing or shortness of breath.   cetirizine (ZYRTEC) 10 MG tablet Take 1 tablet (10 mg total) by mouth daily.   levonorgestrel (MIRENA) 20 MCG/24HR IUD 1 each by Intrauterine route once.   methimazole (TAPAZOLE) 5 MG tablet      Allergies:   Penicillins   Social History   Socioeconomic History   Marital status: Married    Spouse name: Not on file   Number of children: Not on file   Years of education: Not on file   Highest education level: Not on file  Occupational History   Not on file  Tobacco Use   Smoking status: Never   Smokeless tobacco: Never  Substance and Sexual Activity   Alcohol use: Never   Drug use: No   Sexual activity: Yes    Birth control/protection: I.U.D.  Other Topics Concern   Not on file  Social History Narrative   Not on file   Social Drivers of Health   Financial Resource Strain: Medium Risk (07/14/2023)   Received from [facility]   Overall Financial Resource Strain (CARDIA)    Difficulty of Paying Living Expenses: Somewhat hard  Food Insecurity: No Food Insecurity (07/14/2023)   Received from [facility]   Hunger Vital Sign    Worried About Running Out of Food in the Last Year: Never true    Ran Out of Food in the Last Year: Never true  Transportation Needs: No Transportation Needs (07/14/2023)   Received from [facility] - Transportation    Lack of Transportation (Medical): No    Lack of Transportation (Non-Medical): No  Physical Activity: Insufficiently Active (07/14/2023)   Received from [facility]   Exercise Vital Sign    Days of Exercise per Week: 2 days    Minutes of Exercise per Session: 70 min  Stress: Stress Concern Present (07/14/2023)    Received from [facility] - Occupational Stress Questionnaire    Feeling of Stress : To some extent  Social Connections: Moderately Integrated (07/14/2023)   Received from [facility]   Social Network    How would you rate your social network (family, work, friends)?: Adequate participation with social networks     Family History: The patient's family history includes Asthma in her mother; Diabetes in her father; Hypertension in her mother. There is no history of Thyroid disease. ROS:   Please see the history of present illness.    All 14 point review of systems negative except as described per history of present illness.  EKGs/Labs/Other Studies Reviewed:    The following studies were reviewed  today:   EKG:  EKG Interpretation Date/Time:  Thursday January 21 2024 11:16:05 EDT Ventricular Rate:  81 PR Interval:  150 QRS Duration:  80 QT Interval:  358 QTC Calculation: 415 R Axis:   74  Text Interpretation: Normal sinus rhythm Cannot rule out Anterior infarct , age undetermined No previous ECGs available Confirmed by Huntley Dec reddy [phone]) on 01/21/2024 11:43:53 AM    Recent Labs: 11/20/2023: ALT 9; BUN 11; Creatinine 0.68; Hemoglobin 11.0; Platelet Count 154; Potassium 4.1; Sodium 138  Recent Lipid Panel    Component Value Date/Time   CHOL 116 05/21/2022 1513   TRIG 57.0 05/21/2022 1513   HDL 49.50 05/21/2022 1513   CHOLHDL 2 05/21/2022 1513   VLDL 11.4 05/21/2022 1513   LDLCALC 55 05/21/2022 1513    Physical Exam:    VS:  BP 110/70   Pulse 81   Ht 5\' 3"  (1.6 m)   Wt 110 lb 9.6 oz (50.2 kg)   SpO2 98%   BMI 19.59 kg/m     Wt Readings from Last 3 Encounters:  01/21/24 110 lb 9.6 oz (50.2 kg)  11/20/23 109 lb 6.4 oz (49.6 kg)  11/04/23 109 lb (49.4 kg)     GENERAL:  Well nourished, well developed in no acute distress NECK: No JVD; No carotid bruits CARDIAC: RRR, S1 and S2 present, no murmurs, no rubs, no  gallops CHEST:  Clear to auscultation without rales, wheezing or rhonchi  Extremities: No pitting pedal edema. Pulses bilaterally symmetric with radial 2+ and dorsalis pedis 2+ NEUROLOGIC:  Alert and oriented x 3  Medication Adjustments/Labs and Tests Ordered: Current medicines are reviewed at length with the patient today.  Concerns regarding medicines are outlined above.  Orders Placed This Encounter  Procedures   EKG 12-Lead   No orders of the defined types were placed in this encounter.   Signed, Jarold Macomber reddy Iman Reinertsen, MD, MPH, [facility]. 01/21/2024 11:58 AM    [facility]

## 2024-02-08 ENCOUNTER — Ambulatory Visit: Payer: No Typology Code available for payment source | Admitting: Cardiology

## 2024-03-03 ENCOUNTER — Encounter: Payer: Self-pay | Admitting: Family Medicine

## 2024-03-03 MED ORDER — FLUCONAZOLE 150 MG PO TABS: 150.0000 mg | ORAL_TABLET | Freq: Once | ORAL | 0 refills | Status: AC

## 2024-04-10 ENCOUNTER — Encounter: Payer: Self-pay | Admitting: Family Medicine

## 2024-04-11 MED ORDER — ALBUTEROL SULFATE HFA 108 (90 BASE) MCG/ACT IN AERS: 2.0000 | INHALATION_SPRAY | Freq: Four times a day (QID) | RESPIRATORY_TRACT | 4 refills | Status: AC | PRN

## 2024-05-26 ENCOUNTER — Telehealth: Payer: Self-pay

## 2024-05-26 NOTE — Telephone Encounter (Signed)
 Pt has a smart watch that does strips. Advised to send strips in MyChart. Pt verbalized understanding and had no additional questions.

## 2024-05-26 NOTE — Telephone Encounter (Signed)
 Patient c/o Palpitations:  STAT if patient reporting lightheadedness, shortness of breath, or chest pain  How long have you had palpitations/irregular HR/ Afib? Are you having the symptoms now?   No  Are you currently experiencing lightheadedness, SOB or CP?   No  Do you have a history of afib (atrial fibrillation) or irregular heart rhythm?   Yes  Have you checked your BP or HR? (document readings if available):   HR 72 - 15 minutes ago (around 1:45 pm)  Are you experiencing any other symptoms? No  Patient is concerned she has been having increased episodes of palpitations and wants advice on next steps.

## 2024-05-30 ENCOUNTER — Ambulatory Visit: Payer: Self-pay

## 2024-05-30 NOTE — Telephone Encounter (Signed)
 FYI Only or Action Required?: FYI only for provider.  Patient was last seen in primary care on 09/16/2023 by Copland, Lindsay BROCKS, MD.  Called Nurse Triage reporting Palpitations.  Symptoms began several days ago.  Interventions attempted: Nothing.  Symptoms are: unchanged.  Triage Disposition: See PCP When Office is Open (Within 3 Days)  Patient/caregiver understands and will follow disposition?: No appointment, going to urgent care      Copied from CRM #[id number]. Topic: Clinical - Red Word Triage >> May 30, 2024 10:16 AM Lindsay Walker wrote: Red Word that prompted transfer to Nurse Triage: heart palpitations since Thursday        Reason for Disposition  [1] Palpitations AND [2] no improvement after using Care Advice  Answer Assessment - Initial Assessment Questions 1. DESCRIPTION: Please describe your heart rate or heartbeat that you are having (e.g., fast/slow, regular/irregular, skipped or extra beats, palpitations)     Palpitations  2. ONSET: When did it start? (e.g., minutes, hours, days)      4 days ago  3. DURATION: How long does it last (e.g., seconds, minutes, hours)     Approximately 30 seconds  4. PATTERN Does it come and go, or has it been constant since it started?  Does it get worse with exertion?   Are you feeling it now?     Every 2-5 minutes  6. HEART RATE: Can you tell me your heart rate? How many beats in 15 seconds?  Note: Not all patients can do this.       86 bpm  7. RECURRENT SYMPTOM: Have you ever had this before? If Yes, ask: When was the last time? and What happened that time?      No 8. CAUSE: What do you think is causing the palpitations?     Unsure  9. CARDIAC HISTORY: Do you have any history of heart disease? (e.g., heart attack, angina, bypass surgery, angioplasty, arrhythmia)      No 10. OTHER SYMPTOMS: Do you have any other symptoms? (e.g., dizziness, chest pain, sweating, difficulty breathing)       No 11.  PREGNANCY: Is there any chance you are pregnant? When was your last menstrual period?       No  Protocols used: Heart Rate and Heartbeat Questions-A-AH

## 2024-05-30 NOTE — Telephone Encounter (Signed)
FYI. Pt going to UC.

## 2024-06-17 ENCOUNTER — Encounter: Payer: Self-pay | Admitting: Family
